# Patient Record
Sex: Female | Born: 1935 | Race: Black or African American | Hispanic: No | Smoking: Never smoker
Health system: Southern US, Community
[De-identification: ages and names within clinical notes are randomized; demographics above are authoritative.]

## PROBLEM LIST (undated history)

## (undated) DIAGNOSIS — K219 Gastro-esophageal reflux disease without esophagitis: Secondary | ICD-10-CM

## (undated) DIAGNOSIS — R0602 Shortness of breath: Secondary | ICD-10-CM

## (undated) DIAGNOSIS — M109 Gout, unspecified: Secondary | ICD-10-CM

## (undated) DIAGNOSIS — R011 Cardiac murmur, unspecified: Secondary | ICD-10-CM

## (undated) DIAGNOSIS — I10 Essential (primary) hypertension: Secondary | ICD-10-CM

## (undated) DIAGNOSIS — I4891 Unspecified atrial fibrillation: Secondary | ICD-10-CM

## (undated) DIAGNOSIS — I38 Endocarditis, valve unspecified: Secondary | ICD-10-CM

## (undated) DIAGNOSIS — C50919 Malignant neoplasm of unspecified site of unspecified female breast: Secondary | ICD-10-CM

## (undated) DIAGNOSIS — E785 Hyperlipidemia, unspecified: Secondary | ICD-10-CM

## (undated) DIAGNOSIS — I509 Heart failure, unspecified: Secondary | ICD-10-CM

## (undated) HISTORY — DX: Endocarditis, valve unspecified: I38

## (undated) HISTORY — DX: Hyperlipidemia, unspecified: E78.5

## (undated) HISTORY — DX: Unspecified atrial fibrillation: I48.91

## (undated) HISTORY — DX: Essential (primary) hypertension: I10

## (undated) HISTORY — DX: Malignant neoplasm of unspecified site of unspecified female breast: C50.919

## (undated) HISTORY — PX: TUBAL LIGATION: SHX77

## (undated) HISTORY — DX: Gastro-esophageal reflux disease without esophagitis: K21.9

---

## 1978-12-28 HISTORY — PX: APPENDECTOMY: SHX54

## 1999-01-18 ENCOUNTER — Ambulatory Visit (HOSPITAL_COMMUNITY): Admission: RE | Admit: 1999-01-18 | Discharge: 1999-01-18 | Payer: Self-pay | Admitting: Family Medicine

## 1999-03-05 ENCOUNTER — Ambulatory Visit (HOSPITAL_COMMUNITY): Admission: RE | Admit: 1999-03-05 | Discharge: 1999-03-05 | Payer: Self-pay | Admitting: *Deleted

## 1999-04-29 HISTORY — PX: CARDIAC VALVE REPLACEMENT: SHX585

## 1999-05-02 ENCOUNTER — Ambulatory Visit (HOSPITAL_COMMUNITY): Admission: RE | Admit: 1999-05-02 | Discharge: 1999-05-02 | Payer: Self-pay | Admitting: Interventional Cardiology

## 1999-05-31 ENCOUNTER — Encounter: Payer: Self-pay | Admitting: Cardiothoracic Surgery

## 1999-06-03 ENCOUNTER — Inpatient Hospital Stay (HOSPITAL_COMMUNITY): Admission: RE | Admit: 1999-06-03 | Discharge: 1999-06-11 | Payer: Self-pay | Admitting: Cardiothoracic Surgery

## 1999-06-03 ENCOUNTER — Encounter: Payer: Self-pay | Admitting: Cardiothoracic Surgery

## 1999-06-04 ENCOUNTER — Encounter: Payer: Self-pay | Admitting: Cardiothoracic Surgery

## 1999-06-05 ENCOUNTER — Encounter: Payer: Self-pay | Admitting: Cardiothoracic Surgery

## 1999-06-06 ENCOUNTER — Encounter: Payer: Self-pay | Admitting: Cardiothoracic Surgery

## 1999-06-07 ENCOUNTER — Encounter: Payer: Self-pay | Admitting: Thoracic Surgery (Cardiothoracic Vascular Surgery)

## 1999-07-10 ENCOUNTER — Encounter (HOSPITAL_COMMUNITY): Admission: RE | Admit: 1999-07-10 | Discharge: 1999-10-08 | Payer: Self-pay | Admitting: Interventional Cardiology

## 2002-09-23 ENCOUNTER — Ambulatory Visit (HOSPITAL_COMMUNITY): Admission: RE | Admit: 2002-09-23 | Discharge: 2002-09-23 | Payer: Self-pay | Admitting: Orthopedic Surgery

## 2003-08-16 ENCOUNTER — Ambulatory Visit (HOSPITAL_COMMUNITY): Admission: RE | Admit: 2003-08-16 | Discharge: 2003-08-17 | Payer: Self-pay | Admitting: Orthopedic Surgery

## 2004-03-14 ENCOUNTER — Ambulatory Visit (HOSPITAL_COMMUNITY): Admission: RE | Admit: 2004-03-14 | Discharge: 2004-03-14 | Payer: Self-pay | Admitting: *Deleted

## 2005-04-01 ENCOUNTER — Ambulatory Visit (HOSPITAL_COMMUNITY): Admission: RE | Admit: 2005-04-01 | Discharge: 2005-04-01 | Payer: Self-pay | Admitting: Orthopedic Surgery

## 2005-10-03 ENCOUNTER — Ambulatory Visit (HOSPITAL_COMMUNITY): Admission: RE | Admit: 2005-10-03 | Discharge: 2005-10-04 | Payer: Self-pay | Admitting: Orthopedic Surgery

## 2005-10-22 ENCOUNTER — Encounter: Admission: RE | Admit: 2005-10-22 | Discharge: 2005-10-22 | Payer: Self-pay | Admitting: Orthopedic Surgery

## 2008-11-01 ENCOUNTER — Encounter: Admission: RE | Admit: 2008-11-01 | Discharge: 2008-11-01 | Payer: Self-pay | Admitting: Orthopedic Surgery

## 2008-11-27 ENCOUNTER — Encounter (INDEPENDENT_AMBULATORY_CARE_PROVIDER_SITE_OTHER): Payer: Self-pay | Admitting: Orthopedic Surgery

## 2008-11-27 ENCOUNTER — Ambulatory Visit (HOSPITAL_COMMUNITY): Admission: RE | Admit: 2008-11-27 | Discharge: 2008-11-27 | Payer: Self-pay | Admitting: Orthopedic Surgery

## 2009-10-02 ENCOUNTER — Ambulatory Visit: Payer: Self-pay | Admitting: Diagnostic Radiology

## 2009-10-02 ENCOUNTER — Emergency Department (HOSPITAL_BASED_OUTPATIENT_CLINIC_OR_DEPARTMENT_OTHER): Admission: EM | Admit: 2009-10-02 | Discharge: 2009-10-02 | Payer: Self-pay | Admitting: Emergency Medicine

## 2010-04-28 HISTORY — PX: BREAST SURGERY: SHX581

## 2010-05-19 ENCOUNTER — Encounter: Payer: Self-pay | Admitting: Family Medicine

## 2010-05-26 ENCOUNTER — Emergency Department (HOSPITAL_COMMUNITY)
Admission: EM | Admit: 2010-05-26 | Discharge: 2010-05-26 | Payer: Self-pay | Source: Home / Self Care | Admitting: Emergency Medicine

## 2010-05-26 LAB — PROTIME-INR: INR: 1.74 — ABNORMAL HIGH (ref 0.00–1.49)

## 2010-08-04 LAB — PROTIME-INR
INR: 1.5 (ref 0.00–1.49)
Prothrombin Time: 18.7 seconds — ABNORMAL HIGH (ref 11.6–15.2)

## 2010-08-04 LAB — CBC
MCHC: 34 g/dL (ref 30.0–36.0)
WBC: 8.2 10*3/uL (ref 4.0–10.5)

## 2010-08-04 LAB — COMPREHENSIVE METABOLIC PANEL
ALT: 30 U/L (ref 0–35)
AST: 45 U/L — ABNORMAL HIGH (ref 0–37)
BUN: 33 mg/dL — ABNORMAL HIGH (ref 6–23)
CO2: 27 mEq/L (ref 19–32)
GFR calc Af Amer: 52 mL/min — ABNORMAL LOW (ref >60)
Total Bilirubin: 0.7 mg/dL (ref 0.3–1.2)
Total Protein: 7.1 g/dL (ref 6.0–8.3)

## 2010-08-04 LAB — APTT: aPTT: 44 seconds — ABNORMAL HIGH (ref 24–37)

## 2010-09-10 NOTE — Op Note (Signed)
[name], [name] NO.:  [id number]   MEDICAL RECORD NO.:  [id number]          PATIENT TYPE:  AMB   LOCATION:  SDS                          FACILITY:  [facility]   PHYSICIAN:  Myrtie Neither, MD      DATE OF BIRTH:  Mar 02, 1934   DATE OF PROCEDURE:  11/27/2008  DATE OF DISCHARGE:  11/27/2008                               OPERATIVE REPORT   PREOPERATIVE DIAGNOSIS:  Foreign body, right foot plantar callosity,  cellulitis.   POSTOPERATIVE DIAGNOSIS:  Foreign body plantar callosity, right foot.   ANESTHESIA:  Local 0.25% Marcaine and mask.   PROCEDURE:  Excision of foreign body, right foot.   The patient was taken to the operating room and after given adequate  preop medication, the patient was given local 0.25% plain Marcaine of  the right foot and mask assistance.  Right foot was prepped with  Betadine scrub and painted with Betadine solution and draped in sterile  manner.  Mini C-arm was used revealed there was visualized foreign body.  An elliptical excision of the plantar callosity over the third  metatarsal head was done down to the fascia.  Irrigation was done.  Further inspection did not reveal any other foreign body.  Mini C-arm  was used to make sure that it was removed.  X-ray did not demonstrate  any metal fragment, which was previously seen on plain x-rays.  Copious  irrigation was done.  Wound was closed with 2-0 nylon.  Compressive  dressing was applied.  A Darkot shoe was applied.  The patient tolerated  the procedure quite well, went to the recovery room in stable and  satisfactory condition.  The patient has been discharged home with use  of Darkot shoe to the right foot, ice packs elevation, Percocet 1 q.6  p.r.n. and return to the office in 1 week.  The patient has been  discharged in stable and satisfactory condition.      Myrtie Neither, MD  Electronically Signed     Myrtie Neither, MD  Electronically Signed    AC/MEDQ  D:  11/27/2008   T:  11/27/2008  Job:  [id number]

## 2010-09-13 NOTE — Op Note (Signed)
[name], MEINDERS             ACCOUNT NO.:  [id number]   MEDICAL RECORD NO.:  [id number]          PATIENT TYPE:  AMB   LOCATION:  SDS                          FACILITY:  [facility]   PHYSICIAN:  Myrtie Neither, MD      DATE OF BIRTH:  Sep 15, 1933   DATE OF PROCEDURE:  04/01/2005  DATE OF DISCHARGE:                                 OPERATIVE REPORT   PREOPERATIVE DIAGNOSIS:  Metatarsalgia, plantar callosity right foot.   POSTOPERATIVE DIAGNOSIS:  Metatarsalgia, plantar callosity right foot.   ANESTHESIA:  Ankle block and IV.   PROCEDURE:  Osteotomy, third right metatarsal.   The patient was taken to the operating room after given adequate preop  medication.  The patient was given ankle block.  Patient also received IV  anesthesia as well for sedation.  Right foot was prepped with DuraPrep and  draped in standard manner.  Bovie used for hemostasis.  Longitudinal  incision was made dorsally over the third metatarsal going through the skin  and subcutaneous tissue.  Sharp and blunt dissection was made down to the  third metatarsal in line with the plantar callosity.  With a very small  oscillating blade, the osteotomy was then done of the third metatarsal,  allowing the distal end to retract proximally.  Small edge of the dorsal  aspect of the distal end of the metatarsal was resected.  Smoothened.  Copious irrigation was done.  Wound closure was then done with 3-0 nylon.  Compressive dressings applied.  The patient tolerated the procedure well and  went to the recovery room in stable and satisfactory condition.   The patient is being kept for 23-hour observation due to her cardiac history  and being on Coumadin.  The patient will be discharged in a.m. on Percocet  one to two q.4 p.r.n. for pain, ice packs, elevation and partial  weightbearing with the use of walker.      Myrtie Neither, MD  Electronically Signed     AC/MEDQ  D:  04/01/2005  T:  04/01/2005  Job:  [id number]

## 2010-09-13 NOTE — Op Note (Signed)
NAME:  [name], [name] NO.:  [id number]   MEDICAL RECORD NO.:  [id number]          PATIENT TYPE:  AMB   LOCATION:  ENDO                         FACILITY:  [facility]   PHYSICIAN:  Meade Maw, M.D.    DATE OF BIRTH:  01-11-1934   DATE OF PROCEDURE:  03/14/2004  DATE OF DISCHARGE:                                 OPERATIVE REPORT   INDICATIONS FOR PROCEDURE:  Evaluation for severe tricuspid regurgitation.   SURGEON:  Seymour Bars, D.O.   PROCEDURE:  After obtaining written informed consent, the patient was  brought to the endoscopy lab in the postabsorptive state.  Preop sedation  was achieved using IV Versed 2 mg, fentanyl 50 mics.  Topical anesthesia was  achieved using Cetacaine spray and viscous Lidocaine.  Following appropriate  sedation, the OmniPlane probe was introduced with digital guidance without  difficulty.  Multiple views were obtained at the midesophageal basal and  deep gastric view.  Color flow was performed across the mitral, tricuspid,  aortic and pulmonic valve.  Spectral Doppler analysis was performed across  the mitral, aortic, tricuspid valve.  Bubble study was performed.  Left  atrial appendage was visualized.  Ascending and descending aortic arch was  visualized.   FINDINGS:  There was mild to moderate left atrial enlargement.  There was  spontaneous contrast noted in the left atrium.  There was no thrombus noted  in the left atrial appendage. The mitral valve ring was intact. There was  mild to moderate mitral regurgitation noted.  There was normal LV  dimensions, mild concentric hypertrophy with preserved systolic function,  grossly normal tricuspid valve morphology. There was severe tricuspid  regurgitation noted, unable to obtain a full envelope across the tricuspid  valve, secondary to the angle.  There was mild right ventricular enlargement  noted. There was mild aortic sclerosis associated with mild aortic  insufficiency.  There was no  significant velocity across the aortic valve.  The descending aortic arch was visualized.  There was minimal aortic plaque  noted in the descending aorta. There was a negative bubble study.   IMPRESSION:  1.  Severe tricuspid regurgitation.  2.  Mild to moderate mitral regurgitation with intact mitral valve ring.  3.  Mild to moderate left atrial enlargement.  4.  Bacterial endocarditis.  Prophylaxis is recommended.      Hele  HP/MEDQ  D:  03/14/2004  T:  03/15/2004  Job:  [id number]   cc:   Lesleigh Noe, M.D.  [address]  [address]  [place]  [place] [address]  Fax: [phone]

## 2010-09-13 NOTE — Op Note (Signed)
[name], [name] NO.:  [id number]   MEDICAL RECORD NO.:  [id number]          PATIENT TYPE:  AMB   LOCATION:  SDS                          FACILITY:  [facility]   PHYSICIAN:  Myrtie Neither, MD      DATE OF BIRTH:  02/13/1934   DATE OF PROCEDURE:  10/03/2005  DATE OF DISCHARGE:                                 OPERATIVE REPORT   PREOP DIAGNOSIS:  Metatarsalgia right foot with plantar callosities and  bunion.   POSTOPERATIVE DIAGNOSIS:  Metatarsalgia with plantar callosity right foot  with bunion.   ANESTHESIA:  Regional ankle block.   PROCEDURE:  Mitchell bunionectomy right foot.   DESCRIPTION OF PROCEDURE:  The patient was taken to the operating room; and  after given adequate preop medications; the patient had regional block to  the right ankle.  The right lower leg was prepped with DuraPrep and draped  in a sterile manner.  Tourniquet and Bovie used for hemostasis.  A medial  incision made over the first metatarsal phalangeal joint going through the  skin, subcutaneous tissue, and going through previous scar.  Sharp and blunt  dissection was made down to the first metatarsal head.  Capsule was  reflected distally.  Bunion was resected with an osteotome.  An osteotomy  was then done of the first metatarsal, shifting the distal fragment distally  and proximally.  This was stable with a 0.62 K-wire.  Capsule was  reattached, irrigation was done, hemostasis obtained with the Bovie.  Subcutaneous closed with 2-0 Vicryl, skin with 4-0 nylon.  A compressive  dressing was applied.  Pin was bent and cut external to the skin.  The  patient tolerated the procedure quite well and went to the recovery room in  stable and satisfactory condition.  The patient was placed in the fracture  shoe .  The patient will be kept for 23-hour observation.  Ice packs, pain  control; and will be discharged in the a.m. on Percocet 1-2 q.4 h. p.r.n.  for pain.  Nonweightbearing on the  right foot.      Myrtie Neither, MD  Electronically Signed     AC/MEDQ  D:  10/03/2005  T:  10/03/2005  Job:  [id number]

## 2010-10-10 ENCOUNTER — Other Ambulatory Visit: Payer: Self-pay

## 2010-10-11 ENCOUNTER — Other Ambulatory Visit: Payer: Self-pay | Admitting: Radiology

## 2010-10-11 DIAGNOSIS — C50911 Malignant neoplasm of unspecified site of right female breast: Secondary | ICD-10-CM

## 2010-10-15 ENCOUNTER — Ambulatory Visit
Admission: RE | Admit: 2010-10-15 | Discharge: 2010-10-15 | Disposition: A | Discharge status: Home / Self Care | Payer: Medicare Other | Source: Ambulatory Visit | Attending: Radiology | Admitting: Radiology

## 2010-10-15 DIAGNOSIS — C50911 Malignant neoplasm of unspecified site of right female breast: Secondary | ICD-10-CM

## 2010-10-15 MED ORDER — GADOBENATE DIMEGLUMINE 529 MG/ML IV SOLN
8.0000 mL | Freq: Once | INTRAVENOUS | Status: AC | PRN
  Administered 2010-10-15: 8 mL via INTRAVENOUS

## 2010-10-21 ENCOUNTER — Encounter (INDEPENDENT_AMBULATORY_CARE_PROVIDER_SITE_OTHER): Payer: Self-pay | Admitting: General Surgery

## 2010-10-21 ENCOUNTER — Ambulatory Visit (INDEPENDENT_AMBULATORY_CARE_PROVIDER_SITE_OTHER): Payer: Medicare Other | Admitting: General Surgery

## 2010-10-21 DIAGNOSIS — C50919 Malignant neoplasm of unspecified site of unspecified female breast: Secondary | ICD-10-CM

## 2010-10-21 DIAGNOSIS — I38 Endocarditis, valve unspecified: Secondary | ICD-10-CM

## 2010-10-21 NOTE — Patient Instructions (Signed)
You will be referred to a medical oncologist and Dr. Mendel Ryder for pre op evaluations.

## 2010-10-21 NOTE — Progress Notes (Signed)
Subjective:     Patient ID: [name], female   DOB: [birth year], 77 y.o.   MRN: [id number]    BP 158/92  Pulse 80  Temp(Src) 97 F (36.1 C) (Temporal)  Ht 5\' 2"  (1.575 m)  Wt 163 lb 12.8 oz (74.299 kg)  BMI 29.96 kg/m2    HPI This is a 77 year old Latin American female referred by the psoas breast imaging for evaluation of a newly diagnosed invasive ductal carcinoma in the right breast superiorly. The patient was unaware of this, but now she can feel it.She says that she gets mammograms every year and his year. She has had mammograms,ultrasound, MRI, and image guided biopsy. She has a solid sort of solitary, 1.4 cm mass in the right breast, upper central area. The left breast looks normal. The lymph nodes looked normal. There is a 4.5 cm left hepatic lobe cyst. We have discussed her case at  Breast multidisciplinary conference last week. She is felt to be a candidate for definitive breast surgery, but medical oncology feels that she will need adjuvant chemotherapy post op. I have reviewed her mammograms, and her MRI.  The pathology report show  invasive ductal carcinoma, high-grade, a triple negative tumor  Review of Systems  Constitutional: Negative.   HENT: Negative.   Eyes: Negative.   Respiratory: Negative.   Cardiovascular: Negative.   Gastrointestinal: Negative.   Genitourinary: Negative.   Musculoskeletal: Negative.   Skin: Negative.   Neurological: Negative.   Hematological: Negative.   Psychiatric/Behavioral: Negative.        Objective:   Physical Exam  Constitutional: She is oriented to person, place, and time. She appears well-developed. No distress.  HENT:  Head: Normocephalic and atraumatic.  Eyes: Left eye exhibits no discharge. No scleral icterus.  Neck: Normal range of motion. No JVD present. No tracheal deviation present. No thyromegaly present.  Cardiovascular: Normal rate.  An irregular rhythm present. PMI is not displaced.  Exam reveals decreased  pulses.   Murmur heard.  Systolic murmur is present with a grade of 3/6  Pulmonary/Chest: Breath sounds normal. No accessory muscle usage. No apnea and not bradypneic. No respiratory distress. She has no wheezes. She has no rales. Right breast exhibits mass.         1.5 cm. Mobile mass right breast mass at 12 o'clock 3 cm. Above areola  Abdominal: Soft. Bowel sounds are normal.  Musculoskeletal: Normal range of motion.  Lymphadenopathy:    She has no cervical adenopathy.  Neurological: She is alert and oriented to person, place, and time.       Poor historian  Skin: Skin is warm and dry. She is not diaphoretic.  Psychiatric: She has a normal mood and affect. Her behavior is normal.       Assessment:     1. Invasive breast carcinoma, right breast, 12:00 position, T1C, N0 clinical stage, triple negative tumor. 2. Valvular heart disease, status post heart valve replacement on Coumadin. 3. Hypertension 4. Glaucoma. Status post appendectomy    Plan:     I had a long discussion with the patient and her son about the management of her breast cancer. We talked about the fact that this is an invasive, high-grade, triple negative tumor. She is aware that we have discussed her care and best multidisciplinary conference, and that the recommendation was for her to have definitive breast surgery, a Port-A-Cath, and adjuvant chemotherapy. She is willing to consider this but would like to speak the medical oncologist first.  It is her desire to have breast conservation surgery if possible, I think that we can do this, although she will lose some volume in the upper pole of her right breast. We will refer her to one of the breast oncologist to discuss the pros and cons of chemotherapy and to make a final decision about insertion of Port-A-Cath, for definitive breast surgery. She'll be referred to Dr. Veatrice Kells her cardiologist, for preoperative clearance and a decision regarding management of her  Coumadin in the perioperative period. She will return to see me after she sees these 2 physicians to make final decisions regarding her definitive breast surgery. If everything goes according to plan, we will schedule her for right partial mastectomy with needle localization, right sentinel lymph node mapping and biopsy and insertion of Port-A-Cath at hospital near future. I will see her back in the office in about 2 weeks.

## 2010-10-24 ENCOUNTER — Other Ambulatory Visit: Payer: Self-pay | Admitting: Oncology

## 2010-10-24 ENCOUNTER — Emergency Department (HOSPITAL_COMMUNITY): Payer: Medicare Other

## 2010-10-24 ENCOUNTER — Encounter (HOSPITAL_BASED_OUTPATIENT_CLINIC_OR_DEPARTMENT_OTHER): Payer: Medicare Other | Admitting: Oncology

## 2010-10-24 ENCOUNTER — Inpatient Hospital Stay (HOSPITAL_COMMUNITY)
Admission: EM | Admit: 2010-10-24 | Discharge: 2010-11-05 | DRG: 682 | Disposition: A | Discharge status: Home Health | Payer: Medicare Other | Attending: Internal Medicine | Admitting: Internal Medicine

## 2010-10-24 DIAGNOSIS — E875 Hyperkalemia: Secondary | ICD-10-CM | POA: Diagnosis present

## 2010-10-24 DIAGNOSIS — N179 Acute kidney failure, unspecified: Principal | ICD-10-CM | POA: Diagnosis present

## 2010-10-24 DIAGNOSIS — I872 Venous insufficiency (chronic) (peripheral): Secondary | ICD-10-CM | POA: Diagnosis present

## 2010-10-24 DIAGNOSIS — C50919 Malignant neoplasm of unspecified site of unspecified female breast: Secondary | ICD-10-CM | POA: Diagnosis present

## 2010-10-24 DIAGNOSIS — R791 Abnormal coagulation profile: Secondary | ICD-10-CM | POA: Diagnosis present

## 2010-10-24 DIAGNOSIS — I4891 Unspecified atrial fibrillation: Secondary | ICD-10-CM | POA: Diagnosis present

## 2010-10-24 DIAGNOSIS — I509 Heart failure, unspecified: Secondary | ICD-10-CM | POA: Diagnosis present

## 2010-10-24 DIAGNOSIS — C50219 Malignant neoplasm of upper-inner quadrant of unspecified female breast: Secondary | ICD-10-CM

## 2010-10-24 DIAGNOSIS — R609 Edema, unspecified: Secondary | ICD-10-CM | POA: Diagnosis present

## 2010-10-24 DIAGNOSIS — K560 Paralytic ileus: Secondary | ICD-10-CM | POA: Diagnosis present

## 2010-10-24 DIAGNOSIS — Z954 Presence of other heart-valve replacement: Secondary | ICD-10-CM

## 2010-10-24 DIAGNOSIS — Z171 Estrogen receptor negative status [ER-]: Secondary | ICD-10-CM

## 2010-10-24 DIAGNOSIS — E872 Acidosis, unspecified: Secondary | ICD-10-CM | POA: Diagnosis present

## 2010-10-24 DIAGNOSIS — I5033 Acute on chronic diastolic (congestive) heart failure: Secondary | ICD-10-CM | POA: Diagnosis present

## 2010-10-24 DIAGNOSIS — M109 Gout, unspecified: Secondary | ICD-10-CM | POA: Diagnosis not present

## 2010-10-24 DIAGNOSIS — I2789 Other specified pulmonary heart diseases: Secondary | ICD-10-CM | POA: Diagnosis present

## 2010-10-24 DIAGNOSIS — N189 Chronic kidney disease, unspecified: Secondary | ICD-10-CM | POA: Diagnosis present

## 2010-10-24 DIAGNOSIS — T45515A Adverse effect of anticoagulants, initial encounter: Secondary | ICD-10-CM | POA: Diagnosis present

## 2010-10-24 LAB — COMPREHENSIVE METABOLIC PANEL
ALT: 34 U/L (ref 0–35)
AST: 36 U/L (ref 0–37)
Albumin: 3.3 g/dL — ABNORMAL LOW (ref 3.5–5.2)
Alkaline Phosphatase: 128 U/L — ABNORMAL HIGH (ref 39–117)
CO2: 16 mEq/L — ABNORMAL LOW (ref 19–32)
Calcium: 8.7 mg/dL (ref 8.4–10.5)
Chloride: 108 mEq/L (ref 96–112)
Creatinine, Ser: 3.66 mg/dL — ABNORMAL HIGH (ref 0.50–1.10)
Creatinine, Ser: 3.61 mg/dL — ABNORMAL HIGH (ref 0.50–1.10)
GFR calc Af Amer: 15 mL/min — ABNORMAL LOW (ref >60)
GFR calc non Af Amer: 12 mL/min — ABNORMAL LOW (ref >60)
Glucose, Bld: 89 mg/dL (ref 70–99)
Potassium: 6.7 mEq/L (ref 3.5–5.3)
Total Bilirubin: 0.3 mg/dL (ref 0.3–1.2)
Total Bilirubin: 0.2 mg/dL — ABNORMAL LOW (ref 0.3–1.2)

## 2010-10-24 LAB — CBC WITH DIFFERENTIAL/PLATELET
Basophils Absolute: 0 10*3/uL (ref 0.0–0.1)
EOS%: 1.8 % (ref 0.0–7.0)
Eosinophils Absolute: 0.2 10*3/uL (ref 0.0–0.5)
MCH: 29.4 pg (ref 25.1–34.0)
MCV: 89.2 fL (ref 79.5–101.0)
MONO#: 0.9 10*3/uL (ref 0.1–0.9)
NEUT#: 7.5 10*3/uL — ABNORMAL HIGH (ref 1.5–6.5)
Platelets: 226 10*3/uL (ref 145–400)
WBC: 9.5 10*3/uL (ref 3.9–10.3)

## 2010-10-24 LAB — CBC
MCH: 28.5 pg (ref 26.0–34.0)
MCV: 89 fL (ref 78.0–100.0)
Platelets: 198 10*3/uL (ref 150–400)
RBC: 3.19 MIL/uL — ABNORMAL LOW (ref 3.87–5.11)
RDW: 16.6 % — ABNORMAL HIGH (ref 11.5–15.5)
WBC: 9.4 10*3/uL (ref 4.0–10.5)

## 2010-10-24 LAB — DIFFERENTIAL
Basophils Absolute: 0.1 10*3/uL (ref 0.0–0.1)
Eosinophils Absolute: 0.2 10*3/uL (ref 0.0–0.7)
Lymphocytes Relative: 11 % — ABNORMAL LOW (ref 12–46)
Monocytes Absolute: 0.9 10*3/uL (ref 0.1–1.0)
Neutro Abs: 7.2 10*3/uL (ref 1.7–7.7)
Neutrophils Relative %: 76 % (ref 43–77)

## 2010-10-24 LAB — PRO B NATRIURETIC PEPTIDE: Pro B Natriuretic peptide (BNP): 8910 pg/mL — ABNORMAL HIGH (ref 0–125)

## 2010-10-24 LAB — PROTIME-INR: Prothrombin Time: 46.7 seconds — ABNORMAL HIGH (ref 11.6–15.2)

## 2010-10-25 ENCOUNTER — Inpatient Hospital Stay (HOSPITAL_COMMUNITY): Payer: Medicare Other

## 2010-10-25 ENCOUNTER — Other Ambulatory Visit (HOSPITAL_COMMUNITY): Payer: Medicare Other

## 2010-10-25 DIAGNOSIS — R141 Gas pain: Secondary | ICD-10-CM

## 2010-10-25 DIAGNOSIS — R143 Flatulence: Secondary | ICD-10-CM

## 2010-10-25 DIAGNOSIS — K560 Paralytic ileus: Secondary | ICD-10-CM

## 2010-10-25 DIAGNOSIS — R142 Eructation: Secondary | ICD-10-CM

## 2010-10-25 DIAGNOSIS — E876 Hypokalemia: Secondary | ICD-10-CM

## 2010-10-25 LAB — CBC
Hemoglobin: 8.9 g/dL — ABNORMAL LOW (ref 12.0–15.0)
MCH: 28.7 pg (ref 26.0–34.0)
MCH: 28.3 pg (ref 26.0–34.0)
MCHC: 32.6 g/dL (ref 30.0–36.0)
MCHC: 31.8 g/dL (ref 30.0–36.0)
MCV: 88.1 fL (ref 78.0–100.0)
MCV: 89.2 fL (ref 78.0–100.0)
Platelets: 180 10*3/uL (ref 150–400)
RBC: 3.1 MIL/uL — ABNORMAL LOW (ref 3.87–5.11)
RBC: 3.14 MIL/uL — ABNORMAL LOW (ref 3.87–5.11)
RDW: 16.8 % — ABNORMAL HIGH (ref 11.5–15.5)

## 2010-10-25 LAB — PROTIME-INR: Prothrombin Time: 56.8 seconds — ABNORMAL HIGH (ref 11.6–15.2)

## 2010-10-25 LAB — URINALYSIS, MICROSCOPIC ONLY
Glucose, UA: NEGATIVE mg/dL
Ketones, ur: NEGATIVE mg/dL
Nitrite: NEGATIVE
Specific Gravity, Urine: 1.02 (ref 1.005–1.030)
pH: 5 (ref 5.0–8.0)

## 2010-10-25 LAB — IRON AND TIBC
Saturation Ratios: 13 % — ABNORMAL LOW (ref 20–55)
TIBC: 260 ug/dL (ref 250–470)
UIBC: 227 ug/dL

## 2010-10-25 LAB — LIPID PANEL
Cholesterol: 126 mg/dL (ref 0–200)
HDL: 43 mg/dL (ref >39)
Triglycerides: 55 mg/dL (ref <150)
VLDL: 11 mg/dL (ref 0–40)

## 2010-10-25 LAB — BASIC METABOLIC PANEL
BUN: 38 mg/dL — ABNORMAL HIGH (ref 6–23)
BUN: 47 mg/dL — ABNORMAL HIGH (ref 6–23)
CO2: 15 mEq/L — ABNORMAL LOW (ref 19–32)
CO2: 17 mEq/L — ABNORMAL LOW (ref 19–32)
CO2: 19 mEq/L (ref 19–32)
Calcium: 7.9 mg/dL — ABNORMAL LOW (ref 8.4–10.5)
Calcium: 8.6 mg/dL (ref 8.4–10.5)
Chloride: 107 mEq/L (ref 96–112)
Creatinine, Ser: 3.03 mg/dL — ABNORMAL HIGH (ref 0.50–1.10)
Creatinine, Ser: 3.5 mg/dL — ABNORMAL HIGH (ref 0.50–1.10)
GFR calc Af Amer: 17 mL/min — ABNORMAL LOW (ref >60)
GFR calc non Af Amer: 13 mL/min — ABNORMAL LOW (ref >60)
GFR calc non Af Amer: 15 mL/min — ABNORMAL LOW (ref >60)
Glucose, Bld: 110 mg/dL — ABNORMAL HIGH (ref 70–99)
Glucose, Bld: 84 mg/dL (ref 70–99)
Potassium: 5.3 mEq/L — ABNORMAL HIGH (ref 3.5–5.1)
Sodium: 137 mEq/L (ref 135–145)

## 2010-10-25 LAB — CARDIAC PANEL(CRET KIN+CKTOT+MB+TROPI)
Relative Index: 3.1 — ABNORMAL HIGH (ref 0.0–2.5)
Total CK: 161 U/L (ref 7–177)
Troponin I: <0.3 ng/mL (ref <0.30)
Troponin I: <0.3 ng/mL (ref <0.30)
Troponin I: <0.3 ng/mL (ref <0.30)

## 2010-10-25 LAB — FERRITIN: Ferritin: 249 ng/mL (ref 10–291)

## 2010-10-25 LAB — LACTIC ACID, PLASMA: Lactic Acid, Venous: 0.8 mmol/L (ref 0.5–2.2)

## 2010-10-25 LAB — URIC ACID: Uric Acid, Serum: 10.2 mg/dL — ABNORMAL HIGH (ref 2.4–7.0)

## 2010-10-25 LAB — MRSA PCR SCREENING: MRSA by PCR: NEGATIVE

## 2010-10-26 DIAGNOSIS — I5033 Acute on chronic diastolic (congestive) heart failure: Secondary | ICD-10-CM

## 2010-10-26 LAB — DIFFERENTIAL
Eosinophils Absolute: 0 10*3/uL (ref 0.0–0.7)
Eosinophils Relative: 0 % (ref 0–5)
Lymphocytes Relative: 7 % — ABNORMAL LOW (ref 12–46)
Lymphs Abs: 0.6 10*3/uL — ABNORMAL LOW (ref 0.7–4.0)
Monocytes Absolute: 0.5 10*3/uL (ref 0.1–1.0)

## 2010-10-26 LAB — BASIC METABOLIC PANEL
BUN: 30 mg/dL — ABNORMAL HIGH (ref 6–23)
Calcium: 7.8 mg/dL — ABNORMAL LOW (ref 8.4–10.5)
Chloride: 113 mEq/L — ABNORMAL HIGH (ref 96–112)
Creatinine, Ser: 2.5 mg/dL — ABNORMAL HIGH (ref 0.50–1.10)
GFR calc Af Amer: 23 mL/min — ABNORMAL LOW (ref >60)
GFR calc non Af Amer: 19 mL/min — ABNORMAL LOW (ref >60)

## 2010-10-26 LAB — CBC
HCT: 27.9 % — ABNORMAL LOW (ref 36.0–46.0)
MCH: 28.7 pg (ref 26.0–34.0)
MCHC: 32.3 g/dL (ref 30.0–36.0)
MCV: 88.9 fL (ref 78.0–100.0)
Platelets: 179 10*3/uL (ref 150–400)
RDW: 16.8 % — ABNORMAL HIGH (ref 11.5–15.5)
WBC: 9.1 10*3/uL (ref 4.0–10.5)

## 2010-10-26 LAB — PROTIME-INR: Prothrombin Time: 62.5 seconds — ABNORMAL HIGH (ref 11.6–15.2)

## 2010-10-27 LAB — CBC
MCH: 29.3 pg (ref 26.0–34.0)
MCHC: 33.3 g/dL (ref 30.0–36.0)
MCV: 87.9 fL (ref 78.0–100.0)
Platelets: 182 10*3/uL (ref 150–400)
RBC: 3.07 MIL/uL — ABNORMAL LOW (ref 3.87–5.11)

## 2010-10-27 LAB — BASIC METABOLIC PANEL
CO2: 20 mEq/L (ref 19–32)
Calcium: 8.4 mg/dL (ref 8.4–10.5)
Creatinine, Ser: 2.33 mg/dL — ABNORMAL HIGH (ref 0.50–1.10)
GFR calc non Af Amer: 20 mL/min — ABNORMAL LOW (ref >60)
Sodium: 140 mEq/L (ref 135–145)

## 2010-10-27 LAB — PROTIME-INR
INR: 5.49 (ref 0.00–1.49)
Prothrombin Time: 50.7 seconds — ABNORMAL HIGH (ref 11.6–15.2)

## 2010-10-27 NOTE — H&P (Signed)
[name], HENSARLING NO.:  [id number]  MEDICAL RECORD NO.:  [id number]  LOCATION:  WLED                         FACILITY:  [facility]  PHYSICIAN:  Lonia Blood, M.D.      DATE OF BIRTH:  1933/05/15  DATE OF ADMISSION:  10/24/2010 DATE OF DISCHARGE:                             HISTORY & PHYSICAL   PRIMARY CARE PHYSICIAN:  [facility].  PRESENTING COMPLAINT:  Generalized fatigue and abnormal labs.  HISTORY OF PRESENT ILLNESS:  The patient is a 77 year old female who was recently diagnosed with breast cancer from mammogram and is not yet on any chemoradiation.  She went to the cancer center today for blood work, refractory to treatment.  She will have surgery and XRT, but not chemo. She has been having some mild dyspnea on exertion in the last 1-2 weeks and has also noticed generalized weakness.  She normally uses a cane for some unsteady gait.  After blood was drawn, she was on her way home when she was called and asked to come to the emergency room due to abnormal labs.  Her potassium apparently was 6.7 over there. Here in the ED, the potassium was found to be 7.0 with a BUN 49, creatinine 3.61, this seems to be abnormally higher than her usual numbers.  Hence, she is being admitted for further management.  She denied any specific chest pain. Her shortness of breath is occasional on exertion, but not right now. No cough.  No fever.  No chills.  The patient only complains of her left shoulder aching and having pain, otherwise no other complaints.  PAST MEDICAL HISTORY:  Positive for, 1. Diastolic dysfunction with CHF. 2. Atrial fibrillation. 3. History of mitral regurgitation.  She is status post mitral valve     repair and angioplasty, on chronic Coumadin therapy. 4. Hyperlipidemia. 5. Hypertension. 6. Chronic venous insufficiency. 7. History of prior foot surgeries for callosity, cellulitis, and     plantar pain, all done by Dr. Myrtie Neither.  ALLERGIES:  She has no known drug allergies.  CURRENT MEDICATIONS: 1. Amlodipine 10 mg daily. 2. Clonidine 0.1 mg t.i.d. 3. Metoprolol 100 mg b.i.d. 4. Simvastatin 20 mg daily. 5. Warfarin. 6. Lasix 80 mg daily. 7. Potassium chloride 20 mEq daily.  SOCIAL HISTORY:  The patient lives in [place].  She is widowed.  No tobacco, alcohol, or IV drug use.  She uses a cane to move around and she is very much weighted, unable to do all her ADLs.  FAMILY HISTORY:  Noncontributory due to the patient's age.  REVIEW OF SYSTEMS:  All systems reviewed are negative except per HPI.  PHYSICAL EXAMINATION:  VITAL SIGNS:  Temperature is 97.3, blood pressure 123/75 with pulse 86, respiratory rate 20, saturation 97% on room air. GENERAL:  She is awake, alert, oriented, very pleasant woman, in no acute distress. HEENT:  PERRL.  EOMI.  No pallor.  No jaundice.  No rhinorrhea. NECK:  Supple.  No visible JVD.  No lymphadenopathy. RESPIRATORY:  She has good air entry bilaterally.  No wheezes.  No rales.  No crackles. CARDIOVASCULAR SYSTEM:  She has S1 and S2.  No audible murmur. ABDOMEN:  Soft,  full, nontender with positive bowel sounds. EXTREMITIES:  She has chronic venous stasis dermatitis.  Otherwise trace edema, mottled toes. SKIN:  She has very chronic changes in the lower extremities, consistent with chronic stasis dermatitis.  Otherwise no other rashes or ulcers. MUSCULOSKELETAL:  She has some mottled, disfigured joints of the toes. Otherwise, no other did bigger joints involvement.  LABORATORY DATA:  BNP is 8910, otherwise cardiac enzymes negative. Sodium 135, potassium 7.0, chloride 108, CO2 of 16, glucose 89, BUN 49, creatinine 3.61 with a calcium of 8.7.  White count is 9.4, hemoglobin 9.1 with platelet count of 198.  PT 46.0, INR 4.96.  Her chest x-ray showed no acute cardiopulmonary disease.  EKG showed normal sinus rhythm with a rate of 59, no significant ST-T wave  changes.  Her EKG showed atrial fibrillation with borderline R-wave progression, but no significant ST-T wave changes.  ASSESSMENT:  This is a 77 year old female presenting with acute renal failure, more than likely secondary to dehydration and use of Lasix. Other findings are hyperkalemia which may have resulted from a combination of acute renal failure and potassium supplementation.  She also has recent diagnosis of breast cancer and also known history of congestive heart failure, but we do not have note.  Her primary cardiologist is Dr. Verdis Prime.  PLAN: 1. Hyperkalemia, more than likely from potassium supplementation and     acute renal failure.  She has received D50 and insulin in the ED.     Our next plan is to get her on Kayexalate and follow closely with     her potassium levels.  I will repeat the BMP now after receiving     the D50 and see if she makes any changes to her potassium. 2. Acute renal failure.  Again, more than likely this is related to     overuse of Lasix.  We will hold all diuretics and gently hydrate     her while getting 2-D echo to get her true EF to see if we can give     her more fluids. 3. Chronic stasis dermatitis.  This is not acute.  This is chronic and     we will not do much intensive care except to keep her on home care. 4. Atrial fibrillation.  We will keep on her telemetry monitoring and     Continue with her Coumadin.  Her INR is elevated high, so we will     hold the Coumadin for tonight though we will continue to dose it     appropriately. 5. Recent diagnosis of breast cancer.  She is going to have an     outpatient lumpectomy and radiation. 6. Hyperlipidemia.  Continue with her statin. 7. Hypertension.  Blood pressure is reasonable, continue with current     medications. 8. Normocytic anemia, probably anemia of chronic disease.  Follow     hemoglobin and hematocrit closely.  At this point, there is no need     for transfusion  otherwise.     Lonia Blood, M.D.     Verlin Grills  D:  10/24/2010  T:  10/24/2010  Job:  [id number]  Electronically Signed by Lonia Blood M.D. on 10/27/2010 12:51:12 AM

## 2010-10-28 DIAGNOSIS — C50919 Malignant neoplasm of unspecified site of unspecified female breast: Secondary | ICD-10-CM

## 2010-10-28 LAB — BASIC METABOLIC PANEL
BUN: 29 mg/dL — ABNORMAL HIGH (ref 6–23)
CO2: 21 mEq/L (ref 19–32)
Calcium: 7.9 mg/dL — ABNORMAL LOW (ref 8.4–10.5)
Chloride: 107 mEq/L (ref 96–112)
Creatinine, Ser: 1.93 mg/dL — ABNORMAL HIGH (ref 0.50–1.10)
GFR calc Af Amer: 31 mL/min — ABNORMAL LOW (ref >60)
GFR calc non Af Amer: 25 mL/min — ABNORMAL LOW (ref >60)
Glucose, Bld: 97 mg/dL (ref 70–99)
Potassium: 3.4 mEq/L — ABNORMAL LOW (ref 3.5–5.1)
Sodium: 135 mEq/L (ref 135–145)

## 2010-10-29 LAB — BASIC METABOLIC PANEL
BUN: 32 mg/dL — ABNORMAL HIGH (ref 6–23)
Chloride: 108 mEq/L (ref 96–112)
Creatinine, Ser: 1.74 mg/dL — ABNORMAL HIGH (ref 0.50–1.10)
Glucose, Bld: 127 mg/dL — ABNORMAL HIGH (ref 70–99)
Potassium: 3.5 mEq/L (ref 3.5–5.1)

## 2010-10-29 LAB — PROTIME-INR: INR: 2.65 — ABNORMAL HIGH (ref 0.00–1.49)

## 2010-10-30 LAB — CBC
HCT: 26.4 % — ABNORMAL LOW (ref 36.0–46.0)
Hemoglobin: 8.5 g/dL — ABNORMAL LOW (ref 12.0–15.0)
MCV: 86.8 fL (ref 78.0–100.0)
WBC: 15.4 10*3/uL — ABNORMAL HIGH (ref 4.0–10.5)

## 2010-10-30 LAB — URINALYSIS, MICROSCOPIC ONLY
Bilirubin Urine: NEGATIVE
Hgb urine dipstick: NEGATIVE
Ketones, ur: NEGATIVE mg/dL
Nitrite: NEGATIVE
Specific Gravity, Urine: 1.016 (ref 1.005–1.030)
Urobilinogen, UA: 0.2 mg/dL (ref 0.0–1.0)
pH: 5.5 (ref 5.0–8.0)

## 2010-10-30 LAB — BASIC METABOLIC PANEL
BUN: 36 mg/dL — ABNORMAL HIGH (ref 6–23)
Creatinine, Ser: 1.56 mg/dL — ABNORMAL HIGH (ref 0.50–1.10)
GFR calc Af Amer: 39 mL/min — ABNORMAL LOW (ref >60)
GFR calc non Af Amer: 32 mL/min — ABNORMAL LOW (ref >60)
Glucose, Bld: 138 mg/dL — ABNORMAL HIGH (ref 70–99)

## 2010-10-30 LAB — PROTIME-INR: INR: 2.13 — ABNORMAL HIGH (ref 0.00–1.49)

## 2010-10-31 LAB — DIFFERENTIAL
Basophils Absolute: 0 10*3/uL (ref 0.0–0.1)
Basophils Relative: 0 % (ref 0–1)
Lymphocytes Relative: 4 % — ABNORMAL LOW (ref 12–46)
Monocytes Absolute: 1 10*3/uL (ref 0.1–1.0)
Neutro Abs: 14.7 10*3/uL — ABNORMAL HIGH (ref 1.7–7.7)
Neutrophils Relative %: 90 % — ABNORMAL HIGH (ref 43–77)

## 2010-10-31 LAB — BASIC METABOLIC PANEL
CO2: 22 mEq/L (ref 19–32)
Chloride: 108 mEq/L (ref 96–112)
GFR calc Af Amer: 44 mL/min — ABNORMAL LOW (ref >60)
Potassium: 3.3 mEq/L — ABNORMAL LOW (ref 3.5–5.1)
Sodium: 138 mEq/L (ref 135–145)

## 2010-10-31 LAB — CBC
HCT: 27.5 % — ABNORMAL LOW (ref 36.0–46.0)
Hemoglobin: 9.1 g/dL — ABNORMAL LOW (ref 12.0–15.0)
RBC: 3.17 MIL/uL — ABNORMAL LOW (ref 3.87–5.11)

## 2010-10-31 LAB — PROTIME-INR
INR: 1.93 — ABNORMAL HIGH (ref 0.00–1.49)
Prothrombin Time: 22.4 seconds — ABNORMAL HIGH (ref 11.6–15.2)

## 2010-10-31 NOTE — Consult Note (Signed)
[name], [name] NO.:  [id number]  MEDICAL RECORD NO.:  [id number]  LOCATION:  1241                         FACILITY:  [facility]  PHYSICIAN:  Lyn Records, M.D.   DATE OF BIRTH:  1933-12-23  DATE OF CONSULTATION:  10/25/2010 DATE OF DISCHARGE:                                CONSULTATION   CONCLUSIONS: 1. Hyperkalemia with potassium of 7, secondary to acute-on-chronic     renal failure and a potentially dangerous medication regimen if not     followed closely that includes oral potassium, ACE inhibitor     therapy, eplerenone, and a recent reduction in loop diuretic     intensity.     a.     Typical creatinines over the past 2 years have been between      1.4 and 1.6, last checked in my office July 2011.     b.     Recent adjustment in diuretic therapy with apparent      reduction the Lasix dose from 160 mg per day down to 20 mg per day      and perhaps an Aygestin and potassium intake. 2. History of mitral valve disease with severe mitral regurgitation,     treated by mitral valve repair in 2001. 3. Chronic diastolic heart failure.     a.     Last left ventricular ejection fraction 60% in 2009, done at      [facility]. 4. Chronic severe lower extremity edema, secondary to pulmonary     hypertension and chronic venous insufficiency.     a.     Chronic severe stasis skin changes with lichenification of      skin. 5. Chronic atrial fibrillation with usual controlled rate on beta-     blocker therapy, metoprolol succinate 100 mg daily.  RECOMMENDATIONS: 1. Agree with gentle hydration. 2. Kayexalate. 3. Agree with current management of hyperkalemia. 4. Hold the eplerenone, Lasix, potassium supplements, and lisinopril. 5. Will need to re-add her loop diuretic therapy prior to discharge     and I think, she will need more than 20 mg per day, otherwise the     lower extremity edema becomes severe.  Lisinopril and eplerenone     may be restarted at  some later date, but at the current time,     especially given the patient's confusion concerning her     medications, she should probably not be on both distal tubular     aldosterone inhibitor and isosorbide. 6. Continue beta-blocker therapy.  COMMENT:  The patient is well-known to me.  She is 77 years of age and recently diagnosed with breast cancer.  Laboratory data in preparation for a procedure demonstrated severe hyperkalemia with acute-on-chronic renal insufficiency.  She was admitted last knight by the hospitalist service.  I spoke with Dr. Maryjo Rochester this morning and is seeing the patient to provide background information.  She denies shortness of breath, orthopnea, chest pain, syncope, but has had lower extremity cramping, which is severe.  She has been reluctant to use the doses of diuretic therapy that I would recommend because of frequent urination and cramping in her legs.  Compared to my prior note  from January, there has been a significant change in her medical regimen since last saw her. Apparently, Lasix has been decreased to 20 mg per day and eplerenone has been decreased to once a day from twice a day.  There is also some mention of taking extra potassium tablets for leg cramps.  PAST MEDICAL HISTORY:  Please see my office notes and also the problem list above.  PHYSICAL EXAMINATION:  VITAL SIGNS:  On exam, the patient's blood pressure is 110/70, heart rate is 110. NECK:  Veins are not distended. LUNGS:  Reveal no rales. CARDIAC:  Reveals a 3/6 crescendo-decrescendo murmur that increases with inspiration, compatible with tricuspid regurgitation.  There is would like transformation of the lower extremity scan due to severe chronic stasis dermatitis.  EKG on admission demonstrated bradycardia, relative with heart rate of 59 beats per minute, but no acute T-wave abnormality.  No evidence of AV block and atrial fibrillation is present.  Her chest x-ray revealed  no acute cardiopulmonary process.  LABORATORY DATA:  Reveals an INR of 6.3 increasing from 4.9 on admission and potassium now down to 5.3, creatinine is 3.28 down from 3.5, BUN is 38 from 47.  BNP is 8910 with normal troponins.  DISCUSSION:  The patient has mitral valve disease, pulmonary hypertension, severe tricuspid regurgitation, and is status post mitral valve repair.  She was admitted to the hospital with dehydration and hyperkalemia.  There had been recent adjustments in her chronic medical regimen performed by her primary care physician.  She cannot remember the primary care physician's name and unfortunately, I do not have that, one of the physicians in [facility].  My recommendation would be to hold lisinopril, potassium, eplerenone, and for the time being, Lasix into her BUN and creatinine of back into the typical range of 1.6 and 35.  I would then resume her loop diuretic and I believe she will need more than 20 mg per day of furosemide.  We have used less than 80 mg per day, her legs swell tremendously and she develops pulmonary congestion.  Whether or not the eplerenone and lisinopril should ever be restarted, will depend upon stability of her renal function and understanding from medical regimen.  Will follow with you.     Lyn Records, M.D.     HWS/MEDQ  D:  10/25/2010  T:  10/26/2010  Job:  [id number]  Electronically Signed by Verdis Prime M.D. on 10/31/2010 01:40:14 PM

## 2010-11-01 ENCOUNTER — Inpatient Hospital Stay (HOSPITAL_COMMUNITY): Payer: Medicare Other

## 2010-11-01 LAB — BASIC METABOLIC PANEL
CO2: 25 mEq/L (ref 19–32)
Calcium: 8 mg/dL — ABNORMAL LOW (ref 8.4–10.5)
Potassium: 4 mEq/L (ref 3.5–5.1)
Sodium: 140 mEq/L (ref 135–145)

## 2010-11-01 LAB — PROTIME-INR
INR: 1.96 — ABNORMAL HIGH (ref 0.00–1.49)
Prothrombin Time: 22.7 seconds — ABNORMAL HIGH (ref 11.6–15.2)

## 2010-11-01 LAB — CBC
Hemoglobin: 9.2 g/dL — ABNORMAL LOW (ref 12.0–15.0)
MCH: 28.5 pg (ref 26.0–34.0)
Platelets: 204 10*3/uL (ref 150–400)
RBC: 3.23 MIL/uL — ABNORMAL LOW (ref 3.87–5.11)
WBC: 16 10*3/uL — ABNORMAL HIGH (ref 4.0–10.5)

## 2010-11-01 LAB — URINE CULTURE
Colony Count: 75000
Special Requests: NEGATIVE

## 2010-11-01 LAB — PRO B NATRIURETIC PEPTIDE: Pro B Natriuretic peptide (BNP): 6271 pg/mL — ABNORMAL HIGH (ref 0–125)

## 2010-11-02 LAB — BASIC METABOLIC PANEL
BUN: 34 mg/dL — ABNORMAL HIGH (ref 6–23)
CO2: 25 mEq/L (ref 19–32)
Chloride: 100 mEq/L (ref 96–112)
Creatinine, Ser: 1.19 mg/dL — ABNORMAL HIGH (ref 0.50–1.10)

## 2010-11-02 LAB — DIFFERENTIAL
Basophils Relative: 0 % (ref 0–1)
Eosinophils Relative: 0 % (ref 0–5)
Lymphocytes Relative: 5 % — ABNORMAL LOW (ref 12–46)
Monocytes Absolute: 1.4 10*3/uL — ABNORMAL HIGH (ref 0.1–1.0)
Monocytes Relative: 9 % (ref 3–12)
Neutro Abs: 13.3 10*3/uL — ABNORMAL HIGH (ref 1.7–7.7)

## 2010-11-02 LAB — CBC
HCT: 32 % — ABNORMAL LOW (ref 36.0–46.0)
Hemoglobin: 10.7 g/dL — ABNORMAL LOW (ref 12.0–15.0)
MCH: 29.1 pg (ref 26.0–34.0)
MCHC: 33.4 g/dL (ref 30.0–36.0)
RDW: 16.2 % — ABNORMAL HIGH (ref 11.5–15.5)

## 2010-11-03 LAB — BASIC METABOLIC PANEL
BUN: 36 mg/dL — ABNORMAL HIGH (ref 6–23)
CO2: 32 mEq/L (ref 19–32)
Calcium: 8.1 mg/dL — ABNORMAL LOW (ref 8.4–10.5)
Creatinine, Ser: 1.25 mg/dL — ABNORMAL HIGH (ref 0.50–1.10)
Glucose, Bld: 76 mg/dL (ref 70–99)

## 2010-11-03 LAB — CBC
MCH: 28.4 pg (ref 26.0–34.0)
MCHC: 32.6 g/dL (ref 30.0–36.0)
MCV: 87.2 fL (ref 78.0–100.0)
Platelets: 191 10*3/uL (ref 150–400)
RDW: 16.1 % — ABNORMAL HIGH (ref 11.5–15.5)

## 2010-11-04 LAB — CBC
Hemoglobin: 10.7 g/dL — ABNORMAL LOW (ref 12.0–15.0)
MCH: 27.8 pg (ref 26.0–34.0)
MCHC: 31.2 g/dL (ref 30.0–36.0)

## 2010-11-04 LAB — BASIC METABOLIC PANEL
BUN: 34 mg/dL — ABNORMAL HIGH (ref 6–23)
Calcium: 8.1 mg/dL — ABNORMAL LOW (ref 8.4–10.5)
GFR calc non Af Amer: 47 mL/min — ABNORMAL LOW (ref >60)
Glucose, Bld: 79 mg/dL (ref 70–99)
Sodium: 138 mEq/L (ref 135–145)

## 2010-11-04 LAB — DIFFERENTIAL
Basophils Relative: 0 % (ref 0–1)
Monocytes Absolute: 1.3 10*3/uL — ABNORMAL HIGH (ref 0.1–1.0)
Monocytes Relative: 10 % (ref 3–12)
Neutro Abs: 9.7 10*3/uL — ABNORMAL HIGH (ref 1.7–7.7)

## 2010-11-04 LAB — PROTIME-INR: INR: 2.07 — ABNORMAL HIGH (ref 0.00–1.49)

## 2010-11-04 NOTE — Consult Note (Signed)
NAMEELASHA, TESS NO.:  [id number]  MEDICAL RECORD NO.:  [id number]  LOCATION:  1241                         FACILITY:  [facility]  PHYSICIAN:  Sandria Bales. Ezzard Standing, M.D.  DATE OF BIRTH:  1933-12-09  DATE OF CONSULTATION:  10/25/2010                                CONSULTATION   HISTORY OF PRESENT ILLNESS:  This is a 77 year old black female who sees [facility] as her primary medical doctor, and Dr. Verdis Prime, from a heart standpoint.  She was at the cancer center on October 24, 2010, when blood work drawn showed she was hyperkalemic.  She was sent to the emergency room where she was found to have a creatinine of 3.6 and was admitted for further management.  ADMITTING DIAGNOSES:  On October 24, 2010 were: 1. Hyperkalemia. 2. Acute renal failure. 3. Chronic venous stasis. 4. Atrial fibrillation.  On admission, she is also found to be over anticoagulated (INR 6.3) and in probable congestive heart failure with her atrial fibrillation.  She complained of some abdominal pain today.  KUB ordered by Dr. _____??____ shows diffuse small bowel and large bowel distention consistent with an ileus.  An ultrasound of her abdomen done today showed contracted gallbladder with a stone in the neck of her gallbladder, multiple liver cysts, and large IVC consistent with probable right heart failure. I was called to evaluate her from a GI standpoint.  Per the patient's history, she has no history of peptic ulcer disease or liver disease.  By her personal history, she did not know she had gallstones.  There is a lipase done today, which was 23.  She has no history of pancreatitis or colon disease.  She has a well-healed lower midline incision.  She thinks it was from appendicitis.  She also says she has had a tubal ligation.  Tonight in talking to me she says she is really not having that much way of the abdominal pain.  PAST MEDICAL HISTORY:  She has a history  of: 1. Diastolic dysfunction with congestive heart failure. 2. She is in chronic atrial fibrillation, on anticoagulation for this. 3. She has a history of mitral regurgitation, had a mitral valve     repair, but apparently still has some leaks from this according to     an echo, I think performed or submitted by Dr. Katrinka Blazing. 4. Hyperlipidemia. 5. History of hypertension. 6. She has chronic venous insufficiency and stasis of her lower     extremities. 7. She has had prior foot surgeries with cellulitis done by Dr. Myrtie Neither.  ALLERGIES:  She has no known drug allergies.  She was also recently diagnosed with a right breast cancer. There is a biopsy report from October 10, 2010, which shows invasive ductal carcinoma, ER receptor 0%, PR receptor 0%, proliferation marker is 90%, and there was no amplification of HER2/neu.  I do not know who she has seen for Medical Oncology at this time of my dictation.  PHYSICAL EXAMINATION:  VITAL SIGNS:  Her temperature is 97.  Her pulse is about 90, blood pressure 123/75. HEENT:  She is an older woman.  She talks well, but she  really cannot give a very good history. NECK:  Supple.  She has jugular venous distention consistent with right heart failure.  She has no mass or tenderness in her neck. LYMPH NODES:  I feel no cervical, supraclavicular, or axillary adenopathy. BREASTS:  Her right breast, she has a palpable mass about the 12 o'clock position consistent with, I think, which is her primary breast cancer. LUNGS:  Clear to auscultation with symmetric breath sounds. HEART:  She has a well-healed median sternotomy scar.  She has a fairly lateral to 3-4/6 systolic murmur. ABDOMEN:  She has a slightly distended abdomen.  She has a well-healed lower midline incision, which I think are hernia defects in this old lower incision, but nothing is incarcerated.  She has no localized tenderness.  She has active bowel sounds.  No guarding or  mass. EXTREMITIES:  She has venous stasis changes in her lower extremities, but she moves her upper and lower extremities without difficulty.  LABORATORY DATA:  Her lactic acid was 0.8.  Her lipase was 23.  Her basic metabolic panel with a sodium of 140, potassium 5.1, glucose of 110, BUN of 36, creatinine of 3.03 and these labs were all done today. Her CBC shows a hemoglobin of 8.9, hematocrit 28, white blood count of 9300.  Her TSH was 1.993 in the normal range.  Her serum cholesterol was 126.  Her PT was 56 with an INR of 6.34.  IMPRESSION: 1. Abdominal distention, ileus probably secondary to acute medical     illness with congestive heart failure, hyperkalemia, and acute     renal failure.  I do not see any surgical cause for any of bowel     problems at this time. 2. Asymptomatic gallstones. 3. Lower abdominal hernia defect.  I think she has some persistent     symptoms.  A CT scan could be done, but I do not think that she is     candidate for surgery at this time for anything anyway. 4. Ileus  secondary to multiple medical problems. 5. Congestive heart failure. 6. Acute renal failure with a creatinine 3.28. 7. Over anticoagulated with a PT of 56 and INR of 6.34. 8. Hyperkalemia, though her potassium is down to 5.3 today. 9. Mitral valve disease with regurgitation. 10.Right breast cancer.  There is an MRI dated October 15, 2010, which     shows a 1.4 irregular mass in the upper     middle third consistent with a right breast cancer. 11.History of mitral valve replacement 12.Chronic venous stasis of her lower extremities.   Sandria Bales. Ezzard Standing, M.D., FACS   DHN/MEDQ  D:  10/25/2010  T:  10/26/2010  Job:  [id number]  cc:   [name]. [name], M.D. [address] [place] [address]  [facility]  Dr. Garnette Scheuermann  Electronically Signed by Ovidio Kin M.D. on 11/04/2010 01:58:25 PM

## 2010-11-05 ENCOUNTER — Telehealth (INDEPENDENT_AMBULATORY_CARE_PROVIDER_SITE_OTHER): Payer: Self-pay | Admitting: General Surgery

## 2010-11-05 LAB — CBC
HCT: 35.5 % — ABNORMAL LOW (ref 36.0–46.0)
MCHC: 31.5 g/dL (ref 30.0–36.0)
Platelets: 191 10*3/uL (ref 150–400)
RDW: 15.4 % (ref 11.5–15.5)
WBC: 10 10*3/uL (ref 4.0–10.5)

## 2010-11-05 LAB — DIFFERENTIAL
Basophils Absolute: 0.1 10*3/uL (ref 0.0–0.1)
Eosinophils Absolute: 0.1 10*3/uL (ref 0.0–0.7)
Lymphocytes Relative: 16 % (ref 12–46)
Monocytes Absolute: 1.3 10*3/uL — ABNORMAL HIGH (ref 0.1–1.0)
Neutrophils Relative %: 69 % (ref 43–77)

## 2010-11-05 LAB — BASIC METABOLIC PANEL
BUN: 36 mg/dL — ABNORMAL HIGH (ref 6–23)
CO2: 34 mEq/L — ABNORMAL HIGH (ref 19–32)
Chloride: 92 mEq/L — ABNORMAL LOW (ref 96–112)
Glucose, Bld: 92 mg/dL (ref 70–99)
Potassium: 4.2 mEq/L (ref 3.5–5.1)

## 2010-11-10 NOTE — Discharge Summary (Signed)
  NAMENIYATI, HEINKE NO.:  [id number]  MEDICAL RECORD NO.:  [id number]  LOCATION:  1427                         FACILITY:  [facility]  PHYSICIAN:  Kela Millin, M.D.DATE OF BIRTH:  [birth year]  DATE OF ADMISSION:  10/24/2010 DATE OF DISCHARGE:  11/05/2010                        DISCHARGE SUMMARY - REFERRING   ADDENDUM: Addendum to the discharge summary dictated on November 04, 2010, by Dr. Ramiro Harvest.  HOSPITAL COURSE:  Her stay overnight was uneventful.  Please see the discharge summary dictated by Dr. Ramiro Harvest on November 03, 2008, for details.  Also please see the discharge summary of October 29, 2010, by Dr. Waymon Amato.  Her heart rate remained controlled overnight in the 70s to 90s.  Discussed the patient with Dr. Katrinka Blazing and he agrees that she will be discharged home for outpatient followup and he will coordinate when her Coumadin needs to be held preoperatively with surgery.  DISCHARGE MEDICATIONS: 1. Aspirin 81 mg p.o. daily. 2. Diltiazem 180 mg p.o. daily. 3. Lasix 80 mg p.o. q.a.m. and 40 mg q.p.m. 4. Metoprolol XL 100 mg p.o. b.i.d. 5. Calcium carbonate with vitamin D 2 tablets daily. 6. Clonidine 0.1 mg p.o. b.i.d. 7. Omeprazole 20 mg p.o. b.i.d. 8. KCl 20 mEq p.o. daily. 9. Zocor 40 mg p.o. q.p.m. 10.Travatan ophthalmic 0.004% 1 drop in both eyes q.h.s. 11.Coumadin 7.5 mg p.o. daily.  PT/INR to be done on November 07, 2010 and     then as directed per MD  DISCONTINUED MEDICATIONS THIS HOSPITAL STAY:  Eplerenone, amlodipine, lisinopril, clarithromycin, and amoxicillin.  FOLLOWUP CARE: 1. Primary care physician at [facility] for PT/INR on     November 07, 2010, and in 1 to 2 weeks, call for appointment. 2. Dr. [name], call for an appointment on discharge. 3. Dr. Darnelle Catalan, call for appointment upon discharge. 4. Dr. Verdis Prime as scheduled on November 12, 2010. 5. Discharge condition - improved/stable.     Kela Millin,  M.D.     ACV/MEDQ  D:  11/05/2010  T:  11/05/2010  Job:  [id number]  cc:   Lyn Records, M.D. Fax: [phone]  [facility]  Lowella Dell, M.D. Fax: [phone]  [name]. [name], M.D. [address], [address] [place] [address]  Electronically Signed by Donnalee Curry M.D. on 11/10/2010 07:42:16 AM

## 2010-11-11 ENCOUNTER — Telehealth (INDEPENDENT_AMBULATORY_CARE_PROVIDER_SITE_OTHER): Payer: Self-pay

## 2010-11-11 NOTE — Telephone Encounter (Signed)
Patient is scheduled for surgery on July 20th.  She called stating she took her Coumadin today.  I notified Dr [name] who said take no more after that dose and she will get her PT/INR on arrival Friday.  If her blood is abnormal, he may not be able to do surgery.  Patient understands.  She was not instructed to stop her Coumadin per Dr Darnelle Catalan.  The patient is seeing Dr Katrinka Blazing tomorrow.

## 2010-11-13 NOTE — Discharge Summary (Signed)
[name], [name] NO.:  [id number]  MEDICAL RECORD NO.:  [id number]  LOCATION:  1427                         FACILITY:  [facility]  PHYSICIAN:  Ramiro Harvest, MD    DATE OF BIRTH:  July 13, 1933  DATE OF ADMISSION:  10/24/2010 DATE OF DISCHARGE:                        DISCHARGE SUMMARY    ANTICIPATED DATE OF DISCHARGE:  11/05/2010.  PRIMARY CARE PHYSICIAN:  [facility]  GENERAL SURGEON:  [name]. [name], M.D.  ONCOLOGIST:  Lowella Dell, M.D.  CARDIOLOGIST:  Lyn Records, M.D.  This discharge summary covers the periods between October 30, 2010 through November 04, 2010.  For the rest of the hospitalization, please see prior discharge summary dictated per Dr. Waymon Amato of job [id number].  DISCHARGE DIAGNOSES: 1. Acute on chronic diastolic heart failure, improved. 2. Atrial fibrillation. 3. Acute gouty arthropathy, resolved. 4. Acute on chronic kidney disease, resolved. 5. Hyperkalemia, resolved. 6. Mild anion gap metabolic acidosis, resolved. 7. Chronic anemia, stable. 8. Coagulopathy secondary to Coumadin, resolved. 9. Hypertension. 10.Chronic severe lower extremity edema secondary to pulmonary     hypertension and venous insufficiency. 11.Mitral valve disease status post mitral valve repair in 2001. 12.Abdominal pain secondary to ileus, resolved. 13.Cholelithiasis. 14.Probable hernia defect in the lower abdominal incision outpatient     followup. 15.Recently diagnosed clinical T1c N0 grade 3 triple negative breast     cancer of the right breast, for outpatient surgery and radiation     therapy.  Discharge medications will be dictated per discharging physician.  DISPOSITION AND FOLLOWUP:  The patient will be discharged home with home health PT, OT.  The patient is to follow up with Cardiology as scheduled November 12, 2010, at which point in time her rate controlling medications for her atrial fibrillation will be adjusted per her  cardiologist, also her anticoagulation will be coordinated between her cardiologist and the General Surgeon as to when to discontinue her Coumadin in perforation for her breast surgery.  Cardiologist will also follow up on the patient's acute on chronic diastolic heart failure.  The patient is to also follow up with her General Surgeon, Dr. Claud Kelp to reschedule for her breast surgery.  The patient is also to follow up with her PCP as an outpatient.  CONSULTATIONS DONE:  Please see prior discharge summary dictated per Dr. Waymon Amato of job [id number].  PROCEDURES PERFORMED:  During the periods of October 30, 2010 through November 04, 2010, a chest x-ray was done on November 01, 2010 that showed grossly unchanged enlarged cardiac silhouette and pulmonary venous congestion without frank pulmonary edema.  HOSPITAL COURSE:  During the periods of October 30, 2010 through November 04, 2010. 1. Acute on chronic diastolic heart failure.  During this     hospitalization, the patient had initially presented with an acute     on chronic kidney disease, which was felt to be secondary to     aggressive diuresis.  The patient's Lasix had been held and she was     followed and monitored.  Her renal function improved on a daily     basis and had resolved.  The patient then started to develop a     little bit of a  cough.  She was seen by Cardiology and it was felt     that the patient was likely in acute on chronic diastolic heart     failure after her diuretics were held.  The patient's diuretics     were reinstituted and doses adjusted per her cardiologist.  The     patient had good urinary output.  The patient improved clinically     and was in stable and improved condition as of November 04, 2010.  The     patient is currently on Lasix 80 mg in the morning and 40 mg at     night with further adjustments being made per her cardiologist.     The patient is currently stable and improved condition and will     follow up as  outpatient with her cardiologist. 2. Atrial fibrillation.  The patient during this hospitalization was     noted to be in Afib.  Her rate was uncontrolled.  The patient's     heart rate went up at least into the 140s on ambulation with her     physical therapist.  Adjustments were made to the patient's rate     controlling medications.  Her Toprol dose was initially increased.     Her rate was still not well-controlled and digoxin and amiodarone     were also on board.  Digoxin was added and subsequently     discontinued per her cardiologist.  Toprol dose has been adjusted     to 100 twice daily.  She has been also started on diltiazem 180 mg     daily for rate control.  Her Coumadin is currently in therapeutic     range.  Her cardiologist is following the patient and prior to     discharge, final rate controlling medication doses will be     recommended per her cardiologist.  The patient will subsequently     follow up with her cardiologist as an outpatient for further rate     control as needed and also to coordinate when to discontinue her     Coumadin in preparation for her breast surgery.  The patient is     currently in a stable and improved condition and is asymptomatic as     of November 04, 2010. 3. Acute gouty arthropathy.  During the hospitalization, the patient     was noted to be in acute gouty arthropathy.  The patient was placed     on oral steroid taper with resolution of her acute gout flare. 4. Breast cancer, this remains stable.  The patient was seen by Dr.     Claud Kelp on November 03, 2010, it was felt that the patient will     be scheduled for elective outpatient right partial mastectomy with     an needle localization per her General Surgeon.  It was felt the     patient will need to be off her Coumadin for at least 5-6 days as     well as off aspirin for 2-3 days preoperatively.  The question was     to whether the patient will be bridged with Lovenox or heparin.      Once her Coumadin has been discontinued, which will be deferred to     her cardiologist.  Her cardiologist has cleared the patient for     breast surgery.  The patient is currently on Coumadin with a     therapeutic INR.  The  patient will follow up with her cardiologist     as an outpatient and will be coordinated between her cardiologist     and General Surgeon as to the appropriate time to discontinue her     Coumadin in preparation for surgery as well as aspirin and will be     decided per her cardiologist as to whether the patient needs any     bridging with heparin or Lovenox, which will be deferred to her     cardiologist.  The patient's     cardiologist has cleared the patient for her surgery.  The patient     is currently in stable condition.  For the rest of the     hospitalization, please see prior discharge summary dictated per     Dr. Waymon Amato of job [id number].  It has been a pleasure taking care of     Ms. Vela Prose.     Ramiro Harvest, MD     DT/MEDQ  D:  11/04/2010  T:  11/04/2010  Job:  [id number]  cc:   [facility] Fax: [phone]  Lyn Records, M.D. Fax: [phone]  [name]. [name], M.D. [address] [place] [address]  Lowella Dell, M.D. Fax: [phone] Signed by Ramiro Harvest MD on 11/13/2010 06:33:06 PM

## 2010-11-14 ENCOUNTER — Encounter (HOSPITAL_COMMUNITY)
Admission: RE | Admit: 2010-11-14 | Discharge: 2010-11-14 | Disposition: A | Discharge status: Home / Self Care | Payer: Medicare Other | Source: Ambulatory Visit | Attending: General Surgery | Admitting: General Surgery

## 2010-11-14 ENCOUNTER — Telehealth (INDEPENDENT_AMBULATORY_CARE_PROVIDER_SITE_OTHER): Payer: Self-pay

## 2010-11-14 LAB — URINALYSIS, ROUTINE W REFLEX MICROSCOPIC
Bilirubin Urine: NEGATIVE
Hgb urine dipstick: NEGATIVE
Ketones, ur: NEGATIVE mg/dL
Specific Gravity, Urine: 1.017 (ref 1.005–1.030)
Urobilinogen, UA: 0.2 mg/dL (ref 0.0–1.0)

## 2010-11-14 LAB — CBC
HCT: 31.6 % — ABNORMAL LOW (ref 36.0–46.0)
Hemoglobin: 10.3 g/dL — ABNORMAL LOW (ref 12.0–15.0)
MCH: 28.6 pg (ref 26.0–34.0)
MCHC: 32.6 g/dL (ref 30.0–36.0)

## 2010-11-14 LAB — DIFFERENTIAL
Basophils Relative: 0 % (ref 0–1)
Eosinophils Relative: 1 % (ref 0–5)
Lymphocytes Relative: 11 % — ABNORMAL LOW (ref 12–46)
Monocytes Absolute: 1.1 10*3/uL — ABNORMAL HIGH (ref 0.1–1.0)
Monocytes Relative: 7 % (ref 3–12)
Neutro Abs: 11.8 10*3/uL — ABNORMAL HIGH (ref 1.7–7.7)

## 2010-11-14 LAB — COMPREHENSIVE METABOLIC PANEL
BUN: 47 mg/dL — ABNORMAL HIGH (ref 6–23)
Calcium: 8.8 mg/dL (ref 8.4–10.5)
GFR calc Af Amer: 56 mL/min — ABNORMAL LOW (ref >60)
Glucose, Bld: 80 mg/dL (ref 70–99)
Total Protein: 7.6 g/dL (ref 6.0–8.3)

## 2010-11-14 LAB — SURGICAL PCR SCREEN
MRSA, PCR: NEGATIVE
Staphylococcus aureus: NEGATIVE

## 2010-11-14 LAB — PROTIME-INR: INR: 1.99 — ABNORMAL HIGH (ref 0.00–1.49)

## 2010-11-14 NOTE — Telephone Encounter (Signed)
Per Dr Jacinto Halim request pt notified via phone that her lab values are still abnormal and we may have to cx r/s surgery. Pt states she has stopped her coumadin and that Riki Rusk at Dr Lonn Georgia office has placed pt on vit k and green leafy veggies to try to improve her coag values. Pt advised she is to still arrive at hospital pre op and will have stat labs. If the coag labs are not better pt.s surgery will have to be postponed. Pt states she understands.

## 2010-11-15 ENCOUNTER — Ambulatory Visit (HOSPITAL_COMMUNITY)
Admission: RE | Admit: 2010-11-15 | Discharge: 2010-11-15 | Disposition: A | Discharge status: Home / Self Care | Payer: Medicare Other | Source: Ambulatory Visit | Attending: General Surgery | Admitting: General Surgery

## 2010-11-15 ENCOUNTER — Other Ambulatory Visit (INDEPENDENT_AMBULATORY_CARE_PROVIDER_SITE_OTHER): Payer: Self-pay | Admitting: General Surgery

## 2010-11-15 DIAGNOSIS — M109 Gout, unspecified: Secondary | ICD-10-CM | POA: Insufficient documentation

## 2010-11-15 DIAGNOSIS — I10 Essential (primary) hypertension: Secondary | ICD-10-CM | POA: Insufficient documentation

## 2010-11-15 DIAGNOSIS — D0590 Unspecified type of carcinoma in situ of unspecified breast: Secondary | ICD-10-CM

## 2010-11-15 DIAGNOSIS — I359 Nonrheumatic aortic valve disorder, unspecified: Secondary | ICD-10-CM | POA: Insufficient documentation

## 2010-11-15 DIAGNOSIS — Z01812 Encounter for preprocedural laboratory examination: Secondary | ICD-10-CM | POA: Insufficient documentation

## 2010-11-15 DIAGNOSIS — C50419 Malignant neoplasm of upper-outer quadrant of unspecified female breast: Secondary | ICD-10-CM | POA: Insufficient documentation

## 2010-11-15 DIAGNOSIS — Z79899 Other long term (current) drug therapy: Secondary | ICD-10-CM | POA: Insufficient documentation

## 2010-11-15 LAB — PROTIME-INR
INR: 1.32 (ref 0.00–1.49)
Prothrombin Time: 16.6 seconds — ABNORMAL HIGH (ref 11.6–15.2)

## 2010-11-18 NOTE — Op Note (Signed)
NAMESAVVY, PEETERS NO.:  [id number]  MEDICAL RECORD NO.:  [id number]  LOCATION:  SDSC                         FACILITY:  [facility]  PHYSICIAN:  [name]. [name], M.D.DATE OF BIRTH:  February 06, 1934  DATE OF PROCEDURE:  11/16/2010 DATE OF DISCHARGE:  11/15/2010                              OPERATIVE REPORT   PREOPERATIVE DIAGNOSIS:  Invasive ductal carcinoma of right breast, upper outer quadrant, clinical stage T1c, N0.  POSTOPERATIVE DIAGNOSIS:  Invasive ductal carcinoma of right breast, upper outer quadrant, clinical stage T1c, N0.  OPERATION PERFORMED:  Right partial mastectomy with needle localization.  SURGEON:  [name]. [name], MD  OPERATIVE INDICATIONS:  This is a 77 year old African American female with numerous health problems including chronic atrial fibrillation on Coumadin, aortic valve replacement, renal insufficiency, and recent hospitalization for congestive heart failure.  Recent mammograms showed a solid 1.4-cm mass in the right breast upper central area at about the 11 o'clock position.  She had mammograms and MRI and biopsy and this showed invasive ductal carcinoma, high grade, and this was a triple negative tumor.  She has seen me, Dr. Darnelle Catalan, Dr. Chipper Herb, and Dr. Verdis Prime, her cardiologist to discuss the management of this. Dr. Darnelle Catalan does not feel that she needs to have a Port-A-Cath as he does not think she will tolerate cytotoxic chemotherapy.  He does not feel that she needs any lymph node biopsy and we will simply plan postop radiation therapy to the breast and axilla.  This has been discussed with the patient and her family.  She is brought to the operating room for right partial mastectomy.  OPERATIVE TECHNIQUE:  The patient underwent needle localization by Dr. Derinda Late at [facility] today.  The wire entered from the medial aspect and was directed superolaterally.  The wire placement looked good.  The patient  was brought to the operating room at [facility]. General anesthesia was induced.  Intravenous antibiotics were given. The right breast was prepped and draped in a sterile fashion.  Surgical time-out was held identifying correct patient, correct procedure, and correct site.  A 0.5% Marcaine with epinephrine was used as a local infiltration anesthetic.  I used a marking pen and I marked a curvilinear elliptical incision in the upper breast extending a little bit more laterally than medially. The incision was made and dissection was carried down into the breast tissue widely around the area of thickening and the wire.  The specimen was removed and marked with the 6-color margin marker kit.  Specimen mammogram was performed and the localization looked good.  The wire and the marker clip appeared to be in the center of the specimen.  Specimen was then sent for routine histology.  The wound was copiously irrigated with saline.  Hemostasis was excellent and achieved with electrocautery. The breast tissue was closed in 2 layers with interrupted 3-0 Vicryl sutures and the skin was closed with running subcuticular suture of 4-0 Monocryl and Dermabond.  A breast binder was placed.  The patient was awakened and taken to the recovery room in stable condition.  Estimated blood loss was about 10-20 mL.  Complications were none.  Sponge, needle, and instrument counts  were correct.     [name]. [name], M.D.     HMI/MEDQ  D:  11/15/2010  T:  11/16/2010  Job:  [id number]  cc:   Lowella Dell, M.D. Jeralyn Ruths, MD Lyn Records, M.D. Maryln Gottron, M.D.  Electronically Signed by Claud Kelp M.D. on 11/18/2010 05:41:21 PM

## 2010-11-19 NOTE — Discharge Summary (Signed)
[name], [name] NO.:  [id number]  MEDICAL RECORD NO.:  [id number]  LOCATION:  1427                         FACILITY:  [facility]  PHYSICIAN:  Marcellus Scott, MD     DATE OF BIRTH:  Feb 07, 1934  DATE OF ADMISSION:  10/24/2010 DATE OF DISCHARGE:                        DISCHARGE SUMMARY - REFERRING   PRIMARY CARE PHYSICIAN:  [facility].  SURGEON:  [name]. [name], MD  ONCOLOGIST:  Lowella Dell, MD  DISCHARGE DIAGNOSES: 1. Acute-on-chronic kidney disease.  Acute renal failure, resolved. 2. Hyperkalemia, resolved. 3. Mild anion gap metabolic acidosis, resolved. 4. Chronic anemia, stable. 5. Coagulopathy secondary to Coumadin.  Improved.  No bleeding. 6. Atrial fibrillation with controlled ventricular rate. 7. Chronic diastolic congestive heart failure, compensated. 8. Hypertension. 9. Chronic severe lower extremity edema secondary to pulmonary     hypertension and venous insufficiency. 10.Mitral valve disease, status post mitral valve repair in 2001. 11.Abdominal pain secondary to ileus, resolved. 12.Gallstones. 13.Probable hernia defect in the lower abdominal incision, outpatient     followup. 14.Acute gouty arthritis of the right knee, improved. 15.Recently diagnosed clinical T1c N0, grade III triple negative     breast cancer of the right breast, for outpatient surgery and     radiation.  DISCHARGE MEDICATIONS:  To be dictated by discharging MD.  IMAGING: 1. Abdominal x-ray on October 25, 2010.  Impression:  Mild diffuse small     and large bowel distention, most consistent with an ileus.  Early     distal obstruction could have this appearance and followup was     recommended. 2. Complete abdominal ultrasound on October 25, 2010.  Impression:  (a)     contracted gallbladder with nonmobile stone in the neck.     Gallbladder wall thickness at the upper limits of normal.  No     sonographic Murphy sign elicited.  No pericholecystic  fluid.  (b)     Multicystic liver disease.  No intra or extrahepatic biliary ductal     dilatation.  C is enlarged.  IVC and proximal hepatic veins may     indicate a degree of right heart failure. 3. Left shoulder x-ray.  Impression:  Degenerative changes without     acute bony findings. 4. Chest x-ray, October 24, 2010.  Impression:  No acute cardiopulmonary     disease. 5. A 2-D echocardiogram on October 25, 2010.  Conclusion:  Left ventricle     systolic function 60% to 65%.  Ventricular septum showed paradox     motion.  There was diastolic flattening.  Moderate to severe aortic     stenosis with moderate aortic regurgitation.  Prior mitral valve     repair with mild-to-moderate mitral valve regurgitation.  The right     ventricle size was moderately dilated.  Severe tricuspid     regurgitation.  Pulmonary artery systolic pressure was 53 mmHg.  CONSULTATIONS: 1. Surgery, Sandria Bales. Ezzard Standing, M.D. 2. Oncology, Lowella Dell, M.D. 3. Cardiology, Lyn Records, M.D.  DIET:  Heart healthy diet.  ACTIVITY:  Out of bed with assistance and increase activity gradually.  COMPLAINTS TODAY:  The patient today indicates that her right knee pain, which she  had yesterday is resolved.  She denies any further abdominal pain.  She is tolerating diet.  PHYSICAL EXAMINATION:  GENERAL:  The patient is in no obvious distress. VITAL SIGNS:  Temperature 98 degrees Fahrenheit, pulse 69 per minute and regular, respirations 17 per minute, blood pressure 130/77 mmHg and saturating at 92% on room air. RESPIRATORY:  Clear. CARDIOVASCULAR:  S1 and S2 heard, irregular.  No JVD. ABDOMEN:  Nondistended, nontender, soft and bowel sounds present. CENTRAL NERVOUS SYSTEM:  The patient is awake, alert and oriented x3 with no focal neurological deficits. EXTREMITIES:  With bilateral 2+ pitting edema of the legs.  Right knee has no tenderness.  She has mild right knee swelling and mild warmth.  HOSPITAL COURSE:   [name] is a 77 year old African American female patient with history of chronic diastolic congestive heart failure, severe pulmonary hypertension, atrial fibrillation, on chronic Coumadin, status post mitral valve repair and chronic venous insufficiency who was recently diagnosed to have breast cancer and is awaiting surgery. On the day of admission, the patient went to see her oncologist and was found to have markedly abnormal labs including hyperkalemia and acute renal failure.  She was advised to come to the  emergency room for admission for evaluation and management.  She also complained of generalized fatigue.  1. Acute-on-chronic kidney disease.  The patient's baseline creatinine     is 1.4 to 1.6.  Her acute renal failure was possibly secondary to     diuretics including Lasix and eplerenone.  These medications were     obviously held.  The patient was gently hydrated with IV fluids.     Renal obstruction was ruled out.  With these measures, the patient     has made a gradual recovery with resolution of her acute renal     failure and creatinine almost at baseline.  She will obviously have     to reinitiate diuretics soon for her chronic diastolic right heart     failure.  Await dosing recommendations by her primary cardiologist     who continued to see her in the hospital.  Her basic metabolic     panel will also have to be closely followed while she is on these     diuretics. 2. Hyperkalemia secondary to acute renal failure and potassium     supplements.  Recently her Lasix dose had been cut down from 160     per day to 20 mg daily.  She was also asked to take more potassium     supplements for leg cramps.  These probably contributed to her     hyperkalemia.  This was treated with Kayexalate and this has     resolved.  She had mild hypokalemia yesterday, which is better     today.  Again, this will have to be closely monitored once she is     started on her diuretics. 3. Mild  anion gap metabolic acidosis secondary to acute renal failure.     Resolved. 4. Anemia, which is possibly from chronic disease or chronic kidney     disease.  This is stable. 5. Coagulopathy secondary to Coumadin.  She was not bleeding.  This     was not reversed and this has gradually trended down and Pharmacy     is adjusting her Coumadin.  This will need to be followed up also     as an outpatient while she is on Coumadin. 6. Atrial fibrillation with controlled ventricular rate.  She is     continued on her home medications. 7. Chronic diastolic congestive heart failure.  This is compensated.     Lasix and her eplerenone were held.  Prior to discharge, she will     be initiated on Lasix as per the cardiology recommendations     probably at a higher dose than the 20 mg per day that she was on     prior to admission.  At some point, she will also need to     reinitiate her lisinopril and need close followup as an outpatient. 8. Hypertension.  Reasonably controlled. 9. Abdominal pain secondary to ileus.  Surgery was consulted.  She was     managed conservatively.  This has improved and her diet has been     advanced.  Surgeons recommend an elective outpatient followup CT     abdomen with contrast.  However, given her chronic kidney disease,     it is probably better to do it without contrast to evaluate for her     incisional hernia. 10.Recently diagnosed right breast cancer.  The patient is to follow     up with her surgeon as an outpatient to reschedule her surgery. 11.Acute gouty arthritis of her right knee.  The patient was started     on oral prednisone yesterday.  Clinically she has done very well.     Recommend tapering her prednisone after the first 2 days over a 5-     day period.  DISPOSITION:  The patient was seen by Physical Therapy/Occupational Therapy yesterday and they had recommended a skilled nursing facility placement secondary to her inability to ambulate.  This  was, however, in the context of right knee pain.  She declines going to a skilled facility and indicates that she will go home and her family will be able to assist her.  She apparently also takes care of her 82 year old mother at home.  At this time, the case manager and social worker are trying to discuss with her family to see if truly they will be able to support her.  The patient should be able to discharge in the next 24 hours pending clarification of her home environment and the patient's agreeability to go home and Cardiology followup regarding discharging medications.  FOLLOWUP RECOMMENDATIONS: 1. The patient will need close followup of her basic metabolic panel     and INR while her diuretics and Coumadin are reinitiated. 2. Follow up with Dr. Claud Kelp to reschedule her breast cancer     surgery. 3. Follow up with her primary care physicians at [facility].  TIME SPENT:  Time taken in coordinating this discharge is 45 minutes.     Marcellus Scott, MD     AH/MEDQ  D:  10/29/2010  T:  10/29/2010  Job:  [id number]  cc:   [facility]  [place]. [name], M.D. [address], [address] [place] [address]  Lowella Dell, M.D. Fax: [phone]  Lyn Records, M.D. Fax: [phone]  Electronically Signed by Marcellus Scott MD on 11/19/2010 10:29:07 PM

## 2010-11-22 ENCOUNTER — Telehealth (INDEPENDENT_AMBULATORY_CARE_PROVIDER_SITE_OTHER): Payer: Self-pay | Admitting: General Surgery

## 2010-11-22 NOTE — Telephone Encounter (Signed)
I discussed [name]'s pathology report with her by phone today. She has an appointment to see me back in 2 weeks.

## 2010-12-06 ENCOUNTER — Encounter (INDEPENDENT_AMBULATORY_CARE_PROVIDER_SITE_OTHER): Payer: Self-pay | Admitting: General Surgery

## 2010-12-06 ENCOUNTER — Ambulatory Visit (INDEPENDENT_AMBULATORY_CARE_PROVIDER_SITE_OTHER): Payer: Medicare Other | Admitting: General Surgery

## 2010-12-06 VITALS — BP 136/78 | HR 68 | Temp 96.9°F | Ht 62.0 in | Wt 165.8 lb

## 2010-12-06 DIAGNOSIS — Z9889 Other specified postprocedural states: Secondary | ICD-10-CM

## 2010-12-06 DIAGNOSIS — C50419 Malignant neoplasm of upper-outer quadrant of unspecified female breast: Secondary | ICD-10-CM

## 2010-12-06 NOTE — Progress Notes (Signed)
Subjective:     Patient ID: [name], female   DOB: 1933/07/12, 77 y.o.   MRN: [id number]  HPI This is a 77 year old african American female who recently underwent a right breast partial mastectomy. We found she had a poorly differentiated invasive ductal carcinoma, 1.7 cm diameter, extensive lymphovascular invasion, negative margins, triple negative tumor  She is doing well following her surgery. She has no pain, no wound drainage, no fever no chills. She states that she has an appointment to see Dr. Darnelle Catalan this following Monday. She has not seen a radiation oncologist yet but I told her to anticipate that she would see a radiation oncologist next week as well. I emphasized to her that she will need radiation therapy as that on the middle part of her treatment.  Review of Systems     Objective:   Physical Exam The patient looks well and is in no distress.  The right breast shows a transverse curvilinear incision superiorly. There is no hematoma, no sign of infection, but tenderness, no swelling, no axillary mass.    Assessment:     Poorly differentiated invasive ductal carcinoma, 1.7 cm with extensive lymphovascular invasion, negative margins, triple negative tumor. This is a T1C, Nx tumor. Recovering uneventfully in the early postop period.    Plan:     She will see Dr. Darnelle Catalan next week. She will see a radiation oncologist as well.  She'll return to see me in 2-3 months for a rest exam and cancer check.

## 2010-12-06 NOTE — Patient Instructions (Signed)
Your right breast incision is healing nicely. The Dermabond glue will come off in the next 2 weeks. You may shower normally. You may wear all appear normal clothes. Keep her appointment with Dr. Darnelle Catalan next week. You will need radiation therapy. I will see her back in my office in 3 months, sooner if there are any problems.

## 2010-12-09 ENCOUNTER — Encounter (HOSPITAL_BASED_OUTPATIENT_CLINIC_OR_DEPARTMENT_OTHER): Payer: Medicare Other | Admitting: Oncology

## 2010-12-09 ENCOUNTER — Other Ambulatory Visit: Payer: Self-pay | Admitting: Oncology

## 2010-12-09 ENCOUNTER — Encounter: Payer: Medicare Other | Admitting: Oncology

## 2010-12-09 DIAGNOSIS — C50219 Malignant neoplasm of upper-inner quadrant of unspecified female breast: Secondary | ICD-10-CM

## 2010-12-09 LAB — CBC WITH DIFFERENTIAL/PLATELET
BASO%: 0.3 % (ref 0.0–2.0)
EOS%: 1.1 % (ref 0.0–7.0)
HCT: 30.2 % — ABNORMAL LOW (ref 34.8–46.6)
LYMPH%: 10.1 % — ABNORMAL LOW (ref 14.0–49.7)
MCH: 30 pg (ref 25.1–34.0)
MCHC: 33.4 g/dL (ref 31.5–36.0)
MCV: 89.8 fL (ref 79.5–101.0)
MONO#: 0.7 10*3/uL (ref 0.1–0.9)
NEUT%: 80.5 % — ABNORMAL HIGH (ref 38.4–76.8)
Platelets: 181 10*3/uL (ref 145–400)

## 2010-12-09 LAB — COMPREHENSIVE METABOLIC PANEL
ALT: 34 U/L (ref 0–35)
CO2: 24 mEq/L (ref 19–32)
Creatinine, Ser: 1.42 mg/dL — ABNORMAL HIGH (ref 0.50–1.10)
Total Bilirubin: 1.1 mg/dL (ref 0.3–1.2)

## 2010-12-11 ENCOUNTER — Encounter: Payer: Self-pay | Admitting: General Surgery

## 2010-12-18 ENCOUNTER — Inpatient Hospital Stay (HOSPITAL_COMMUNITY)
Admission: EM | Admit: 2010-12-18 | Discharge: 2010-12-22 | DRG: 682 | Disposition: A | Discharge status: Home Health | Payer: Medicare Other | Attending: Internal Medicine | Admitting: Internal Medicine

## 2010-12-18 ENCOUNTER — Inpatient Hospital Stay (HOSPITAL_COMMUNITY): Payer: Medicare Other

## 2010-12-18 DIAGNOSIS — Z954 Presence of other heart-valve replacement: Secondary | ICD-10-CM

## 2010-12-18 DIAGNOSIS — I5033 Acute on chronic diastolic (congestive) heart failure: Secondary | ICD-10-CM | POA: Diagnosis present

## 2010-12-18 DIAGNOSIS — I509 Heart failure, unspecified: Secondary | ICD-10-CM | POA: Diagnosis present

## 2010-12-18 DIAGNOSIS — T465X5A Adverse effect of other antihypertensive drugs, initial encounter: Secondary | ICD-10-CM | POA: Diagnosis present

## 2010-12-18 DIAGNOSIS — I872 Venous insufficiency (chronic) (peripheral): Secondary | ICD-10-CM | POA: Diagnosis present

## 2010-12-18 DIAGNOSIS — M109 Gout, unspecified: Secondary | ICD-10-CM | POA: Diagnosis present

## 2010-12-18 DIAGNOSIS — I498 Other specified cardiac arrhythmias: Secondary | ICD-10-CM | POA: Diagnosis present

## 2010-12-18 DIAGNOSIS — R7989 Other specified abnormal findings of blood chemistry: Secondary | ICD-10-CM | POA: Diagnosis present

## 2010-12-18 DIAGNOSIS — E785 Hyperlipidemia, unspecified: Secondary | ICD-10-CM | POA: Diagnosis present

## 2010-12-18 DIAGNOSIS — N179 Acute kidney failure, unspecified: Principal | ICD-10-CM | POA: Diagnosis present

## 2010-12-18 DIAGNOSIS — E875 Hyperkalemia: Secondary | ICD-10-CM | POA: Diagnosis present

## 2010-12-18 DIAGNOSIS — I4891 Unspecified atrial fibrillation: Secondary | ICD-10-CM | POA: Diagnosis present

## 2010-12-18 DIAGNOSIS — R791 Abnormal coagulation profile: Secondary | ICD-10-CM | POA: Diagnosis present

## 2010-12-18 DIAGNOSIS — C50919 Malignant neoplasm of unspecified site of unspecified female breast: Secondary | ICD-10-CM | POA: Diagnosis present

## 2010-12-18 DIAGNOSIS — N183 Chronic kidney disease, stage 3 unspecified: Secondary | ICD-10-CM | POA: Diagnosis present

## 2010-12-18 DIAGNOSIS — T45515A Adverse effect of anticoagulants, initial encounter: Secondary | ICD-10-CM | POA: Diagnosis present

## 2010-12-18 DIAGNOSIS — I129 Hypertensive chronic kidney disease with stage 1 through stage 4 chronic kidney disease, or unspecified chronic kidney disease: Secondary | ICD-10-CM | POA: Diagnosis present

## 2010-12-18 LAB — HEPATIC FUNCTION PANEL
ALT: 81 U/L — ABNORMAL HIGH (ref 0–35)
ALT: 81 U/L — ABNORMAL HIGH (ref 0–35)
AST: 118 U/L — ABNORMAL HIGH (ref 0–37)
AST: 110 U/L — ABNORMAL HIGH (ref 0–37)
Alkaline Phosphatase: 132 U/L — ABNORMAL HIGH (ref 39–117)
Alkaline Phosphatase: 122 U/L — ABNORMAL HIGH (ref 39–117)
Bilirubin, Direct: 0.3 mg/dL (ref 0.0–0.3)
Indirect Bilirubin: 0.3 mg/dL (ref 0.3–0.9)
Total Bilirubin: 0.6 mg/dL (ref 0.3–1.2)
Total Protein: 7.5 g/dL (ref 6.0–8.3)

## 2010-12-18 LAB — PROTIME-INR
INR: 5.2 (ref 0.00–1.49)
INR: 5.12 (ref 0.00–1.49)

## 2010-12-18 LAB — BASIC METABOLIC PANEL
BUN: 54 mg/dL — ABNORMAL HIGH (ref 6–23)
Creatinine, Ser: 3.39 mg/dL — ABNORMAL HIGH (ref 0.50–1.10)
GFR calc non Af Amer: 13 mL/min — ABNORMAL LOW (ref >60)
Glucose, Bld: 87 mg/dL (ref 70–99)
Potassium: 6.9 mEq/L (ref 3.5–5.1)

## 2010-12-18 LAB — CBC
HCT: 33.3 % — ABNORMAL LOW (ref 36.0–46.0)
Hemoglobin: 10.4 g/dL — ABNORMAL LOW (ref 12.0–15.0)
MCH: 28.2 pg (ref 26.0–34.0)
MCHC: 31.2 g/dL (ref 30.0–36.0)
MCV: 90.2 fL (ref 78.0–100.0)

## 2010-12-18 LAB — POCT I-STAT, CHEM 8
BUN: 63 mg/dL — ABNORMAL HIGH (ref 6–23)
Calcium, Ion: 1.04 mmol/L — ABNORMAL LOW (ref 1.12–1.32)
Calcium, Ion: 1.11 mmol/L — ABNORMAL LOW (ref 1.12–1.32)
Chloride: 115 mEq/L — ABNORMAL HIGH (ref 96–112)
Chloride: 113 mEq/L — ABNORMAL HIGH (ref 96–112)
Creatinine, Ser: 3.6 mg/dL — ABNORMAL HIGH (ref 0.50–1.10)
Glucose, Bld: 90 mg/dL (ref 70–99)
Glucose, Bld: 61 mg/dL — ABNORMAL LOW (ref 70–99)
HCT: 30 % — ABNORMAL LOW (ref 36.0–46.0)
Potassium: 7 mEq/L (ref 3.5–5.1)
TCO2: 21 mmol/L (ref 0–100)

## 2010-12-18 LAB — URINALYSIS, ROUTINE W REFLEX MICROSCOPIC
Glucose, UA: NEGATIVE mg/dL
Nitrite: NEGATIVE
pH: 5 (ref 5.0–8.0)

## 2010-12-18 LAB — DIFFERENTIAL
Basophils Absolute: 0 10*3/uL (ref 0.0–0.1)
Eosinophils Relative: 0 % (ref 0–5)
Lymphocytes Relative: 12 % (ref 12–46)
Lymphs Abs: 1.3 10*3/uL (ref 0.7–4.0)
Monocytes Absolute: 0.8 10*3/uL (ref 0.1–1.0)
Monocytes Relative: 7 % (ref 3–12)
Neutro Abs: 9 10*3/uL — ABNORMAL HIGH (ref 1.7–7.7)

## 2010-12-19 ENCOUNTER — Inpatient Hospital Stay (HOSPITAL_COMMUNITY): Payer: Medicare Other

## 2010-12-19 ENCOUNTER — Other Ambulatory Visit (HOSPITAL_COMMUNITY): Payer: Medicare Other

## 2010-12-19 LAB — COMPREHENSIVE METABOLIC PANEL
AST: 88 U/L — ABNORMAL HIGH (ref 0–37)
CO2: 23 mEq/L (ref 19–32)
Chloride: 103 mEq/L (ref 96–112)
Creatinine, Ser: 2.83 mg/dL — ABNORMAL HIGH (ref 0.50–1.10)
GFR calc Af Amer: 20 mL/min — ABNORMAL LOW (ref >60)
GFR calc non Af Amer: 16 mL/min — ABNORMAL LOW (ref >60)
Glucose, Bld: 102 mg/dL — ABNORMAL HIGH (ref 70–99)
Total Bilirubin: 0.5 mg/dL (ref 0.3–1.2)

## 2010-12-19 LAB — URINE CULTURE
Colony Count: NO GROWTH
Culture: NO GROWTH

## 2010-12-19 LAB — CARDIAC PANEL(CRET KIN+CKTOT+MB+TROPI)
CK, MB: 2.3 ng/mL (ref 0.3–4.0)
CK, MB: 2.6 ng/mL (ref 0.3–4.0)

## 2010-12-19 LAB — CBC
HCT: 27.2 % — ABNORMAL LOW (ref 36.0–46.0)
Hemoglobin: 8.7 g/dL — ABNORMAL LOW (ref 12.0–15.0)
MCH: 28.4 pg (ref 26.0–34.0)
MCHC: 32 g/dL (ref 30.0–36.0)
MCV: 88.9 fL (ref 78.0–100.0)
RDW: 18.2 % — ABNORMAL HIGH (ref 11.5–15.5)

## 2010-12-19 LAB — PROTIME-INR: INR: 5.07 (ref 0.00–1.49)

## 2010-12-20 ENCOUNTER — Encounter (HOSPITAL_COMMUNITY): Payer: Self-pay

## 2010-12-20 ENCOUNTER — Other Ambulatory Visit (HOSPITAL_COMMUNITY): Payer: Medicare Other

## 2010-12-20 ENCOUNTER — Inpatient Hospital Stay (HOSPITAL_COMMUNITY): Payer: Medicare Other

## 2010-12-20 LAB — CBC
HCT: 29.1 % — ABNORMAL LOW (ref 36.0–46.0)
MCV: 88.2 fL (ref 78.0–100.0)
RBC: 3.3 MIL/uL — ABNORMAL LOW (ref 3.87–5.11)
WBC: 9.6 10*3/uL (ref 4.0–10.5)

## 2010-12-20 LAB — COMPREHENSIVE METABOLIC PANEL
ALT: 68 U/L — ABNORMAL HIGH (ref 0–35)
Alkaline Phosphatase: 123 U/L — ABNORMAL HIGH (ref 39–117)
CO2: 22 mEq/L (ref 19–32)
GFR calc Af Amer: 28 mL/min — ABNORMAL LOW (ref >60)
GFR calc non Af Amer: 23 mL/min — ABNORMAL LOW (ref >60)
Glucose, Bld: 86 mg/dL (ref 70–99)
Potassium: 4.9 mEq/L (ref 3.5–5.1)
Sodium: 134 mEq/L — ABNORMAL LOW (ref 135–145)
Total Bilirubin: 0.8 mg/dL (ref 0.3–1.2)

## 2010-12-20 MED ORDER — TECHNETIUM TC 99M MEBROFENIN IV KIT
5.4000 | PACK | Freq: Once | INTRAVENOUS | Status: AC | PRN
  Administered 2010-12-20: 5.4 via INTRAVENOUS

## 2010-12-20 MED ORDER — SINCALIDE 5 MCG IJ SOLR: 0.0200 ug/kg | Freq: Once | INTRAMUSCULAR | Status: DC

## 2010-12-21 DIAGNOSIS — I498 Other specified cardiac arrhythmias: Secondary | ICD-10-CM

## 2010-12-21 LAB — CBC
MCHC: 31.5 g/dL (ref 30.0–36.0)
RDW: 17.4 % — ABNORMAL HIGH (ref 11.5–15.5)

## 2010-12-21 LAB — COMPREHENSIVE METABOLIC PANEL
ALT: 52 U/L — ABNORMAL HIGH (ref 0–35)
Albumin: 2.9 g/dL — ABNORMAL LOW (ref 3.5–5.2)
Alkaline Phosphatase: 132 U/L — ABNORMAL HIGH (ref 39–117)
Potassium: 4.3 mEq/L (ref 3.5–5.1)
Sodium: 136 mEq/L (ref 135–145)
Total Protein: 6.7 g/dL (ref 6.0–8.3)

## 2010-12-21 LAB — PROTIME-INR
INR: 3.17 — ABNORMAL HIGH (ref 0.00–1.49)
Prothrombin Time: 33 seconds — ABNORMAL HIGH (ref 11.6–15.2)

## 2010-12-21 LAB — TSH: TSH: 1.037 u[IU]/mL (ref 0.350–4.500)

## 2010-12-22 LAB — BASIC METABOLIC PANEL
CO2: 22 mEq/L (ref 19–32)
Chloride: 102 mEq/L (ref 96–112)
Creatinine, Ser: 1.13 mg/dL — ABNORMAL HIGH (ref 0.50–1.10)
Potassium: 4 mEq/L (ref 3.5–5.1)

## 2010-12-22 LAB — CBC
HCT: 30.3 % — ABNORMAL LOW (ref 36.0–46.0)
Hemoglobin: 9.9 g/dL — ABNORMAL LOW (ref 12.0–15.0)
MCH: 28.3 pg (ref 26.0–34.0)
MCHC: 32.7 g/dL (ref 30.0–36.0)

## 2010-12-22 LAB — PROTIME-INR: INR: 2.27 — ABNORMAL HIGH (ref 0.00–1.49)

## 2010-12-24 NOTE — Consult Note (Signed)
[name], [name] NO.:  [id number]  MEDICAL RECORD NO.:  [id number]  LOCATION:  1430                         FACILITY:  [facility]  PHYSICIAN:  Armanda Magic, M.D.     DATE OF BIRTH:  02/28/1934  DATE OF CONSULTATION:  12/20/2010 DATE OF DISCHARGE:                                CONSULTATION   REFERRING PHYSICIAN:  Richarda Overlie, MD  REASON FOR CONSULTATION:  Bradycardia.  HISTORY OF PRESENT ILLNESS:  This is a 77 year old African American female with a history of chronic kidney disease stage 3, diastolic heart failure, atrial fibrillation, hypertension, mitral valve replacement who was admitted with inability to urinate and decreased urine output for several days and nausea.  She was found to be hyperkalemic with acute-on- chronic renal failure.  She was also supratherapeutic on her Coumadin. 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  She has been maintained on her beta-blocker until she was admitted.  This was held.  She has been on Cardizem up until today which was held.  We are now asked to consult because of bradycardia.  PAST MEDICAL HISTORY: 1. Diastolic heart failure. 2. Atrial fibrillation. 3. Chronic kidney disease stage 3. 4. History of hyperkalemia. 5. Gouty arthropathy. 6. Chronic anemia. 7. Hypertension. 8. History of mitral valve disease, status post mitral valve repair in     2001. 9. Cholelithiasis. 10.Recent diagnosis with right breast CA, status post partial right     mastectomy followed by Dr. Darnelle Catalan. 11.Hyperlipidemia. 12.Chronic venous insufficiency.  PAST SURGICAL HISTORY:  Mitral valve replacement, history of appendectomy, multiple surgeries on her right foot and great toe, history of C-section.  SOCIAL HISTORY:  She lives with her family.  She denies  any tobacco or alcohol use.  No IV drug use.  ALLERGIES:  None.  MEDICATIONS: 1. Clonidine 0.1 mg p.o. b.i.d. 2. Calcium carbonate with vitamin D 600 mg/200 international units 1     tablet p.o. b.i.d. 3. Lasix 20 mg daily. 4. Potassium chloride 20 mEq daily. 5. Zocor 40 mg daily. 6. Uloric 40 mg as needed. 7. Travatan eye drops 0.004% both eyes 1 drop at bedtime. 8. Toprol-XL 100 mg daily. 9. Coumadin 5 mg 1-2 tablets daily.  She takes 1 tablet daily except     Monday and Wednesday she takes 2 tablets. 10.Diltiazem CD 240 mg daily.  FAMILY HISTORY:  Noncontributory.  REVIEW OF SYSTEMS:  Other than stated in HPI is negative.  PHYSICAL EXAMINATION:  VITAL SIGNS:  Blood pressure 131/72, heart rate 42-52 beats per minute.  She is afebrile. GENERAL:  This is a well-developed, well-nourished female, in no acute distress. HEENT:  Benign. NECK:  Supple without lymphadenopathy.  Carotid upstrokes +2 bilaterally.  No bruits. LUNGS:  Clear to auscultation throughout. HEART:  Irregularly irregular with no rubs or gallops.  There is a 2/6 systolic murmur at the right upper sternal border that radiates to the apex. ABDOMEN:  Soft. EXTREMITIES:  No edema.  LABORATORY DATA:  Sodium 134, potassium 4.9, chloride 102, CO2 of 22, BUN 48,  creatinine 2.08.  White cell count 9.6, hemoglobin 9.5, hematocrit 29.1.  Cardiac enzymes negative x2.  BNP is elevated at 7845. INR 4.26.  EKG shows atrial fibrillation with slow ventricular response at 46 beats per minute.  ASSESSMENT: 1. Bradycardia secondary to decreased clearance of beta-blocker, in     the setting of acute renal failure.  Beta-blocker was stopped on     admission and calcium channel blocker was stopped this morning. 2. Chronic atrial fibrillation. 3. Systemic anticoagulation with supratherapeutic INR. 4. Acute-on-chronic renal failure secondary to dehydration. 5. Hyperkalemia, resolved. 6. Anemia. 7. Acute-on-chronic diastolic  heart failure.  BNP is elevated     secondary to renal failure.  It is about the same as it was on last     admit, November 01, 2010.  Her lungs were actually clear and she     actually does not appear volume overloaded on exam. 8. Mitral valve disease, status post mitral valve replacement.  PLAN:  Restart Coumadin once INR less than 3.5 per Pharmacy since she has a mitral valve replacement.  Continue to hold beta-blocker and calcium channel blocker. This may take several days to get out of her system secondary to renal failure.  We will check a TSH.     Armanda Magic, M.D.     TT/MEDQ  D:  12/20/2010  T:  12/21/2010  Job:  [id number]  cc:   Lyn Records, M.D. Fax: [phone]  Electronically Signed by Armanda Magic M.D. on 12/24/2010 08:35:45 AM

## 2010-12-25 ENCOUNTER — Ambulatory Visit
Admission: RE | Admit: 2010-12-25 | Discharge: 2010-12-25 | Disposition: A | Discharge status: Home / Self Care | Payer: Medicare Other | Source: Ambulatory Visit | Attending: Radiation Oncology | Admitting: Radiation Oncology

## 2010-12-25 DIAGNOSIS — Z79899 Other long term (current) drug therapy: Secondary | ICD-10-CM | POA: Insufficient documentation

## 2010-12-25 DIAGNOSIS — C50919 Malignant neoplasm of unspecified site of unspecified female breast: Secondary | ICD-10-CM | POA: Insufficient documentation

## 2010-12-25 DIAGNOSIS — H409 Unspecified glaucoma: Secondary | ICD-10-CM | POA: Insufficient documentation

## 2010-12-25 DIAGNOSIS — Z7901 Long term (current) use of anticoagulants: Secondary | ICD-10-CM | POA: Insufficient documentation

## 2010-12-25 DIAGNOSIS — I10 Essential (primary) hypertension: Secondary | ICD-10-CM | POA: Insufficient documentation

## 2010-12-25 DIAGNOSIS — D0590 Unspecified type of carcinoma in situ of unspecified breast: Secondary | ICD-10-CM | POA: Insufficient documentation

## 2010-12-25 DIAGNOSIS — I4891 Unspecified atrial fibrillation: Secondary | ICD-10-CM | POA: Insufficient documentation

## 2010-12-25 DIAGNOSIS — R599 Enlarged lymph nodes, unspecified: Secondary | ICD-10-CM | POA: Insufficient documentation

## 2010-12-25 DIAGNOSIS — Z510 Encounter for antineoplastic radiation therapy: Secondary | ICD-10-CM | POA: Insufficient documentation

## 2011-01-02 ENCOUNTER — Inpatient Hospital Stay (HOSPITAL_COMMUNITY)
Admission: EM | Admit: 2011-01-02 | Discharge: 2011-01-07 | DRG: 293 | Disposition: A | Discharge status: Home Health | Payer: Medicare Other | Attending: Family Medicine | Admitting: Family Medicine

## 2011-01-02 DIAGNOSIS — I498 Other specified cardiac arrhythmias: Secondary | ICD-10-CM | POA: Diagnosis not present

## 2011-01-02 DIAGNOSIS — I079 Rheumatic tricuspid valve disease, unspecified: Secondary | ICD-10-CM | POA: Diagnosis present

## 2011-01-02 DIAGNOSIS — I129 Hypertensive chronic kidney disease with stage 1 through stage 4 chronic kidney disease, or unspecified chronic kidney disease: Secondary | ICD-10-CM | POA: Diagnosis present

## 2011-01-02 DIAGNOSIS — E876 Hypokalemia: Secondary | ICD-10-CM | POA: Diagnosis not present

## 2011-01-02 DIAGNOSIS — R82998 Other abnormal findings in urine: Secondary | ICD-10-CM | POA: Diagnosis present

## 2011-01-02 DIAGNOSIS — Z7901 Long term (current) use of anticoagulants: Secondary | ICD-10-CM

## 2011-01-02 DIAGNOSIS — I4891 Unspecified atrial fibrillation: Secondary | ICD-10-CM | POA: Diagnosis present

## 2011-01-02 DIAGNOSIS — I359 Nonrheumatic aortic valve disorder, unspecified: Secondary | ICD-10-CM | POA: Diagnosis present

## 2011-01-02 DIAGNOSIS — E785 Hyperlipidemia, unspecified: Secondary | ICD-10-CM | POA: Diagnosis present

## 2011-01-02 DIAGNOSIS — I5033 Acute on chronic diastolic (congestive) heart failure: Principal | ICD-10-CM | POA: Diagnosis present

## 2011-01-02 DIAGNOSIS — M109 Gout, unspecified: Secondary | ICD-10-CM | POA: Diagnosis present

## 2011-01-02 DIAGNOSIS — D649 Anemia, unspecified: Secondary | ICD-10-CM | POA: Diagnosis present

## 2011-01-02 DIAGNOSIS — C50919 Malignant neoplasm of unspecified site of unspecified female breast: Secondary | ICD-10-CM | POA: Diagnosis present

## 2011-01-02 DIAGNOSIS — I2789 Other specified pulmonary heart diseases: Secondary | ICD-10-CM | POA: Diagnosis present

## 2011-01-02 DIAGNOSIS — Z79899 Other long term (current) drug therapy: Secondary | ICD-10-CM

## 2011-01-02 DIAGNOSIS — N183 Chronic kidney disease, stage 3 unspecified: Secondary | ICD-10-CM | POA: Diagnosis present

## 2011-01-02 DIAGNOSIS — I509 Heart failure, unspecified: Secondary | ICD-10-CM | POA: Diagnosis present

## 2011-01-02 LAB — POCT I-STAT, CHEM 8
BUN: 40 mg/dL — ABNORMAL HIGH (ref 6–23)
Chloride: 109 mEq/L (ref 96–112)
Creatinine, Ser: 1.9 mg/dL — ABNORMAL HIGH (ref 0.50–1.10)
Sodium: 142 mEq/L (ref 135–145)
TCO2: 22 mmol/L (ref 0–100)

## 2011-01-03 ENCOUNTER — Emergency Department (HOSPITAL_COMMUNITY): Payer: Medicare Other

## 2011-01-03 LAB — CK TOTAL AND CKMB (NOT AT ARMC)
CK, MB: 2.6 ng/mL (ref 0.3–4.0)
Total CK: 69 U/L (ref 7–177)

## 2011-01-03 LAB — COMPREHENSIVE METABOLIC PANEL
AST: 24 U/L (ref 0–37)
Albumin: 3.2 g/dL — ABNORMAL LOW (ref 3.5–5.2)
Alkaline Phosphatase: 130 U/L — ABNORMAL HIGH (ref 39–117)
BUN: 41 mg/dL — ABNORMAL HIGH (ref 6–23)
Chloride: 104 mEq/L (ref 96–112)
Creatinine, Ser: 1.6 mg/dL — ABNORMAL HIGH (ref 0.50–1.10)
Potassium: 3.9 mEq/L (ref 3.5–5.1)
Total Bilirubin: 0.5 mg/dL (ref 0.3–1.2)
Total Protein: 7.4 g/dL (ref 6.0–8.3)

## 2011-01-03 LAB — URINE MICROSCOPIC-ADD ON

## 2011-01-03 LAB — BASIC METABOLIC PANEL
BUN: 40 mg/dL — ABNORMAL HIGH (ref 6–23)
Calcium: 8.8 mg/dL (ref 8.4–10.5)
Creatinine, Ser: 1.66 mg/dL — ABNORMAL HIGH (ref 0.50–1.10)
GFR calc Af Amer: 37 mL/min — ABNORMAL LOW (ref >60)
GFR calc non Af Amer: 30 mL/min — ABNORMAL LOW (ref >60)

## 2011-01-03 LAB — DIFFERENTIAL
Basophils Absolute: 0 10*3/uL (ref 0.0–0.1)
Basophils Relative: 0 % (ref 0–1)
Eosinophils Absolute: 0.2 10*3/uL (ref 0.0–0.7)
Eosinophils Relative: 2 % (ref 0–5)
Monocytes Absolute: 0.8 10*3/uL (ref 0.1–1.0)

## 2011-01-03 LAB — URINALYSIS, ROUTINE W REFLEX MICROSCOPIC
Glucose, UA: NEGATIVE mg/dL
Hgb urine dipstick: NEGATIVE
Protein, ur: 100 mg/dL — AB
Specific Gravity, Urine: 1.018 (ref 1.005–1.030)
pH: 5 (ref 5.0–8.0)

## 2011-01-03 LAB — CBC
HCT: 27.6 % — ABNORMAL LOW (ref 36.0–46.0)
MCHC: 31.9 g/dL (ref 30.0–36.0)
MCHC: 31.9 g/dL (ref 30.0–36.0)
MCV: 87.6 fL (ref 78.0–100.0)
Platelets: 167 10*3/uL (ref 150–400)
Platelets: 158 10*3/uL (ref 150–400)
RDW: 17.5 % — ABNORMAL HIGH (ref 11.5–15.5)
RDW: 17.5 % — ABNORMAL HIGH (ref 11.5–15.5)
WBC: 8.2 10*3/uL (ref 4.0–10.5)
WBC: 7 10*3/uL (ref 4.0–10.5)

## 2011-01-03 LAB — PROTIME-INR: Prothrombin Time: 26.5 seconds — ABNORMAL HIGH (ref 11.6–15.2)

## 2011-01-03 LAB — TROPONIN I: Troponin I: <0.3 ng/mL (ref <0.30)

## 2011-01-03 LAB — PRO B NATRIURETIC PEPTIDE: Pro B Natriuretic peptide (BNP): 8316 pg/mL — ABNORMAL HIGH (ref 0–125)

## 2011-01-04 DIAGNOSIS — I5033 Acute on chronic diastolic (congestive) heart failure: Secondary | ICD-10-CM

## 2011-01-04 LAB — BASIC METABOLIC PANEL
CO2: 26 mEq/L (ref 19–32)
Calcium: 9 mg/dL (ref 8.4–10.5)
Creatinine, Ser: 1.37 mg/dL — ABNORMAL HIGH (ref 0.50–1.10)
GFR calc non Af Amer: 38 mL/min — ABNORMAL LOW (ref >60)

## 2011-01-04 LAB — URINE CULTURE
Colony Count: NO GROWTH
Culture  Setup Time: 201209070709
Culture: NO GROWTH

## 2011-01-04 LAB — CBC
MCV: 87.8 fL (ref 78.0–100.0)
Platelets: 169 10*3/uL (ref 150–400)
RBC: 3.27 MIL/uL — ABNORMAL LOW (ref 3.87–5.11)
RDW: 17.4 % — ABNORMAL HIGH (ref 11.5–15.5)
WBC: 7.5 10*3/uL (ref 4.0–10.5)

## 2011-01-04 LAB — TSH: TSH: 1.828 u[IU]/mL (ref 0.350–4.500)

## 2011-01-04 LAB — PROTIME-INR
INR: 2.11 — ABNORMAL HIGH (ref 0.00–1.49)
Prothrombin Time: 24 seconds — ABNORMAL HIGH (ref 11.6–15.2)

## 2011-01-04 LAB — DIFFERENTIAL
Basophils Absolute: 0 10*3/uL (ref 0.0–0.1)
Basophils Relative: 0 % (ref 0–1)
Eosinophils Absolute: 0.2 10*3/uL (ref 0.0–0.7)
Eosinophils Relative: 3 % (ref 0–5)
Neutrophils Relative %: 74 % (ref 43–77)

## 2011-01-04 LAB — MAGNESIUM: Magnesium: 1.9 mg/dL (ref 1.5–2.5)

## 2011-01-05 LAB — CBC
MCH: 27.3 pg (ref 26.0–34.0)
MCHC: 31.5 g/dL (ref 30.0–36.0)
MCV: 86.6 fL (ref 78.0–100.0)
Platelets: 170 10*3/uL (ref 150–400)
RDW: 17.2 % — ABNORMAL HIGH (ref 11.5–15.5)

## 2011-01-05 LAB — BASIC METABOLIC PANEL
BUN: 39 mg/dL — ABNORMAL HIGH (ref 6–23)
Calcium: 9 mg/dL (ref 8.4–10.5)
Creatinine, Ser: 1.67 mg/dL — ABNORMAL HIGH (ref 0.50–1.10)
GFR calc Af Amer: 36 mL/min — ABNORMAL LOW (ref >60)
GFR calc non Af Amer: 30 mL/min — ABNORMAL LOW (ref >60)
Glucose, Bld: 81 mg/dL (ref 70–99)
Potassium: 3.4 mEq/L — ABNORMAL LOW (ref 3.5–5.1)

## 2011-01-05 LAB — PROTIME-INR: Prothrombin Time: 26.3 seconds — ABNORMAL HIGH (ref 11.6–15.2)

## 2011-01-05 NOTE — Consult Note (Signed)
NAMECACI, ORREN NO.:  [id number]  MEDICAL RECORD NO.:  [id number]  LOCATION:  1408                         FACILITY:  [facility]  PHYSICIAN:  Lyn Records, M.D.   DATE OF BIRTH:  Aug 08, 1933  DATE OF CONSULTATION: DATE OF DISCHARGE:                                CONSULTATION   REASON FOR CONSULTATION:  Uncertain.  CONCLUSIONS: 1. Chronic diastolic heart failure with mild acute component, improved     after several doses of IV Lasix.     a.     According to the patient, there has been a significant      weight gain since her discharge on December 21, 2010, but there are      no weights recorded in discharge summaries to be able to make a      comparison. 2. Chronic atrial fibrillation.     a.     Excessive rate control with pauses up to 2-3 seconds      secondary to combination therapy of diltiazem, metoprolol, and      clonidine. 3. Severe secondary pulmonary hypertension. 4. Mitral valve disease status post mitral valve repair 2001. 5. Severe tricuspid regurgitation. 6. Chronic kidney disease.     a.     Recent acute on chronic kidney failure episodes with      hyperkalemia on October 24, 2010, and December 18, 2010.     b.     Acute kidney failure with creatinine greater than 3 and      potassium greater than 7 on October 24, 2010. 7. Breast cancer, currently undergoing treatment. 8. Chronic anemia. 9. Very poor prognosis due to inability to control the patient's     severe diastolic heart failure, her medication regimen, and her     nutritional intake.  RECOMMENDATIONS: 1. The patient was recently discharged from the hospital on 20 mg of     Lasix per day.  This is an entirely meager dose of diuretic therapy     for a lady with the severity of heart failure that she has.  I will     recommend that we switch her to torsemide which may have better GI     absorption and may improve fluid homeostasis. 2. I would recommend discontinuing the combination of  metoprolol,     diltiazem, and clonidine as this will ultimately result in severe     bradycardia.  I would remove clonidine from this medical regimen     and we may need to decrease the dose of metoprolol, but we will     follow. 3. I would avoid ACE and ARB therapy as these have contributed to     hyperkalemia and renal failure in the past. 4. I feel there are too many physicians involved in her care over the     past 3 months with too many medication changes and no consistent     followup that is a system problem as well as an individual problem     with this patient as she lives alone and has difficulty getting to     doctors' appointments.  This makes her likely outcome ultimately  very poor. 5. We need to consider placement if we are going to control heart     failure because this will be the only way to be certain that her     medications are taken regularly and that her diet is consistent.  COMMENTS:  The patient is 37 and has severe medical problems/comorbidities including history of mitral valve disease with prior mitral valve repair, severe diastolic heart failure, severe pulmonary hypertension secondary to diastolic heart failure and mitral valve disease, moderate-to-severe tricuspid regurgitation contributing to lower extremity swelling, chronic atrial fibrillation that at times can have very rapid ventricular response.  Currently, her ventricular response is relatively slow.  The patient also has significant kidney impairment that can acutely decompensate depending upon her dietary intake which varies widely/wildly.  She has not had chest pain.  She began noticing over the past 3 days that there had been significant weight gain since discharge from the hospital on December 21, 2010.  She could only guess about what her weights have been.  No weights have been documented in the discharge summaries.  We are really in the dark on exactly what her discharge weight was on  8/25.  She became concerned, however, that her thighs were swelling, she was not making urine, and she felt that there was progression of shortness of breath.  She called several places including, according to her my office.  Some of these calls were mediated by the visiting home nurse.  Ultimately, someone told her to come to the emergency room last night, and from there she was admitted to the hospital.  Please see the medication reconciliation form in the admitting history and physical for her medications on admission.  Of note is a very small dose of diuretic therapy that she has been discharged from the hospital on.  As an outpatient, she has been on as much as 160 mg of Lasix daily to prevent excessive fluid accumulation.  PHYSICAL EXAMINATION:  GENERAL:  The patient is lying flat.  She is not short of breath. NECK:  Veins are significantly distended as they always are due to severe pulmonary hypertension. LUNGS:  Clear. CARDIAC EXAM:  Reveals a 3/6 systolic murmur of tricuspid regurgitation. There may also be a faint murmur of mitral regurgitation in the left axilla. ABDOMEN:  There is no ascites.  The liver is not pulsatile. EXTREMITIES:  There is tight lower extremity skin with chronic stasis changes, although the legs are not as taut as they have been at times in the past.  SIGNIFICANT LABORATORY DATA:  Include a hemoglobin of 8.8, WBC of 7.8, platelet count of 150,000.  INR of 2.4.  Potassium 3.6, BUN 40, creatinine 1.7, SGOT 24, alk phos 130.  BNP 8300.  Chest x-ray does not reveal pulmonary edema.  DISCUSSION:  Severe diastolic heart failure with frequent medication changes, aggravated by very sensitive renal function with reference to volume status and multiple hospital admissions for hyperkalemia.  The patient is also relatively bradycardic due to her medication regimen. Please see recommendations above.     Lyn Records, M.D.     HWS/MEDQ  D:   01/03/2011  T:  01/04/2011  Job:  [id number]  cc:   Henri Medal, MD Fax: [phone]  [facility]  Electronically Signed by Verdis Prime M.D. on 01/05/2011 08:12:38 PM

## 2011-01-06 ENCOUNTER — Other Ambulatory Visit (HOSPITAL_COMMUNITY): Payer: Medicare Other

## 2011-01-06 LAB — BASIC METABOLIC PANEL
BUN: 37 mg/dL — ABNORMAL HIGH (ref 6–23)
Creatinine, Ser: 1.41 mg/dL — ABNORMAL HIGH (ref 0.50–1.10)
GFR calc non Af Amer: 36 mL/min — ABNORMAL LOW (ref >60)
Glucose, Bld: 82 mg/dL (ref 70–99)
Potassium: 3.6 mEq/L (ref 3.5–5.1)

## 2011-01-06 LAB — CBC
MCH: 27.1 pg (ref 26.0–34.0)
MCHC: 31.1 g/dL (ref 30.0–36.0)
MCV: 87.2 fL (ref 78.0–100.0)
Platelets: 191 10*3/uL (ref 150–400)
RBC: 3.43 MIL/uL — ABNORMAL LOW (ref 3.87–5.11)

## 2011-01-06 NOTE — Discharge Summary (Signed)
NAMEMERELYN, KLUMP NO.:  [id number]  MEDICAL RECORD NO.:  [id number]  LOCATION:  1430                         FACILITY:  [facility]  PHYSICIAN:  Richarda Overlie, MD       DATE OF BIRTH:  06/11/1933  DATE OF ADMISSION:  12/18/2010 DATE OF DISCHARGE:  12/21/2010                              DISCHARGE SUMMARY   PRIMARY CARE PHYSICIAN:  [facility].  PRIMARY CARDIOLOGIST:  Dr. Verdis Prime  DISCHARGE DIAGNOSES: 1. Bradycardia in the setting of decreased clearance of metoprolol, in     the setting of acute renal failure. 2. Acute on chronic renal failure. 3. Acute on chronic diastolic heart failure. 4. Chronic atrial fibrillation. 5. Systemic anticoagulation with supratherapeutic INR. 6. Hyperkalemia. 7. Anemia. 8. Mitral valve disease status post mitral valve prolapse. 9. Abnormal LFTs with negative workup thus far. 10.Dyslipidemia. 11.Gouty arthropathy. 12.Chronic venous insufficiency.  DISCHARGE MEDICATIONS: 1. Zofran 4 mg p.o. q. 6 p.r.n. 2. Coumadin per pharmacy protocol. 3. Cardizem 180 mg 1 capsule p.o. daily. 4. Warfarin 5 mg every other day. 5. Clonidine 0.1 mg p.o. twice daily. 6. Lasix 20 mg p.o. daily. 7. Os-Cal 500. 8. Vitamin D 1 tablet p.o. twice daily. 9. Travatan 1 drop in both eyes daily. 10.Uloric 1 tablet p.o. daily.  PROCEDURES:  Chest x-ray shows cardiomegaly, pulmonary vascular congestion.  CT scan of the abdomen and pelvis shows serpiginous fluid collection within the right anterior subcutaneous region below the level of the umbilicus.  A 4.5 cm cyst in the left lobe of the liver related to  process impressing upon the liver cannot be completely excluded.  A  2 cm right renal cyst without evidence of hydronephrosis, 1.4 cm gallstone.  HIDA scan shows normal study with normal gallbladder ejection fraction.  SUBJECTIVE:  This is a 77 year old female who presents to the ER with a chief complaint of nausea, inability  to urinate, decreased urine output, and right upper quadrant pain and epigastric pain.  The patient denied any diarrhea or constipation.  Prior to admission, she was found to have a potassium of 7 in the ER and a creatinine of 3.39.  Two months ago, creatinine was 1.14.  HOSPITAL COURSE: 1. Hyperkalemia.  The patient was treated with calcium gluconate, D50,     insulin, and sodium bicarbonate in the ER.  She was also initiated     on Lasix.  With resolution of her hyperkalemia, potassium     supplementation has been discontinued. 2. Acute on chronic kidney injury.  This was thought to be secondary     to hemodynamic changes and prerenal etiology.  The patient has had     good urine output of almost of over 2 L on a daily basis and her     creatinine is down to 1.33.  Her Lasix was held during this     hospitalization, but this is being resumed because of her acute on     chronic diastolic heart failure. 3. Supratherapeutic INR.  The patient was found to have an INR of 5.12     upon admission.  Today her INR is 3.17.  Coumadin will be resumed  and home health RN will be set up for home INR monitoring. 4. Acute on chronic diastolic heart failure with elevated BNP in the     setting of renal insufficiency.  The patient did not have any     evidence of cardiopulmonary compromise and was not hypoxic.  Her     Lasix is being resumed. 5. Chronic atrial fibrillation.  The patient was found to have pauses     on telemetry up to 2.34 seconds long because of decreased clearance     of the beta blocker.  Her diltiazem and her metoprolol was     discontinued.  Cardiology consultation by Dr. Armanda Magic was     obtained and the patient was evaluated by Cardiology.  She did not     have any more pauses for at least 36 hours prior to discharge and     her diltiazem is being resumed.  DISPOSITION:  The patient was evaluated with physical and occupational therapy, and was deemed to be appropriate  for discharge.  PHYSICAL EXAMINATION:  Prior to discharge, her physical examination was; VITAL SIGNS:  Blood pressure 164/91, pulse of 94, temperature 98.3, 97% on room air. GENERAL:  Comfortable, in no acute cardiopulmonary distress. HEENT:  Pupils equal and reactive to light.  Extraocular movements are intact. NECK:  Supple.  No JVD. LUNGS:  Clear to auscultation bilaterally.  No wheezes, crackles, or rhonchi. CARDIOVASCULAR:  Regular rate and rhythm.  No murmurs, rubs, or gallops. ABDOMEN:  Obese, soft, nontender, nondistended. EXTREMITIES:  Without cyanosis, clubbing, or edema.  FOLLOWUP: 1. Followup appointment with Dr. Verdis Prime in 5-7 days for     evaluation of her atrial fibrillation and bradycardia. 2. Follow up with PCP in 5-7 days.     Richarda Overlie, MD     NA/MEDQ  D:  12/21/2010  T:  12/21/2010  Job:  [id number]  cc:   Lyn Records, M.D. Fax: [phone]  [facility]  Electronically Signed by Richarda Overlie MD on 01/06/2011 07:24:48 AM

## 2011-01-07 LAB — BASIC METABOLIC PANEL
BUN: 32 mg/dL — ABNORMAL HIGH (ref 6–23)
CO2: 31 mEq/L (ref 19–32)
Calcium: 9.4 mg/dL (ref 8.4–10.5)
Glucose, Bld: 76 mg/dL (ref 70–99)
Potassium: 3.3 mEq/L — ABNORMAL LOW (ref 3.5–5.1)
Sodium: 137 mEq/L (ref 135–145)

## 2011-01-16 NOTE — Discharge Summary (Signed)
NAMENAI, BORROMEO NO.:  [id number]  MEDICAL RECORD NO.:  [id number]  LOCATION:  1408                         FACILITY:  [facility]  PHYSICIAN:  Gwenyth Bender, NP      DATE OF BIRTH:  03-09-1934  DATE OF ADMISSION:  01/02/2011 DATE OF DISCHARGE:  01/07/2011                        DISCHARGE SUMMARY - REFERRING   PRIMARY CARE PROVIDER:  [facility].  PRIMARY CARDIOLOGIST:  Collier Salina. Katrinka Blazing, MD  ONCOLOGIST:  Unk Lightning, MD  DISCHARGE DIAGNOSES: 1. Acute-on-chronic diastolic heart failure. 2. Atrial fibrillation. 3. Acute-on-chronic kidney disease stage 3. 4. Hypokalemia. 5. Hypertension. 6. Pyuria. 7. History of breast cancer, starting radiation on January 08, 2011. 8. Mitral valve repair in 2001. 9. Severe tricuspid regurgitation. 10.Chronic anemia.  DISCHARGE MEDICATIONS: 1. Potassium chloride 10 mEq p.o. daily. 2. Torsemide 40 mg p.o. daily. 3. Coumadin 2.5 mg p.o. daily. 4. Diltiazem 240 mg p.o. daily. 5. Toprol XL 100 mg p.o. daily. 6. Os-Cal 600 mg/200 international units 1 tablet p.o. b.i.d. 7. Zofran 4 mg p.o. every 6 hours as needed for nausea. 8. Travoprost ophthalmic 0.004% 1 drop in both eyes daily at bedtime. 9. Febuxostat 40 mg p.o. daily.  MEDICATIONS STOPPED THIS HOSPITALIZATION: 1. Lasix 20 mg p.o. daily. 2. Coumadin 5 mg 2 tablets p.o. daily. 3. Clonidine 0.1 mg p.o. b.i.d.  DIAGNOSTIC LABS:  Sodium 142, potassium 4.2, chloride 109, CO2 22, BUN 40, creatinine 1.9.  WBCs 8.2, hemoglobin 9.4, hematocrit 29.5, platelets 167.  Urinalysis:  Small leukocytes, 100 protein, cloudy in appearance, hyaline casts, 3-6 WBCs, many bacteria, few squamous epithelial cells.  Total CK 69, CK-MB 2.6, troponin I less than 0.30. Pro BNP was 8316.  PT 26.5, INR 2.39, PTT 44.  Magnesium 1.9.  TSH is 1.82.  Urine culture shows no growth to date.  DIAGNOSTIC IMAGING:  Chest x-ray on September 7 yields cardiomegaly without  pulmonary edema.  Right pleural effusion.  CONSULTATIONS:  Cardiology, Verdis Prime, MD  PROCEDURES:  None.  BRIEF HISTORY:  [name] is a 77 year old female with a history of diastolic heart failure; mitral valve prolapse, status post repair of 2001; A-fib, on Coumadin; right breast cancer, status post partial right mastectomy; and chronic kidney disease stage 3, who presented to the [facility] ED with a chief complaint of worsening dyspnea on exertion, subjective fluid buildup in her thighs, orthopnea, and a 15 to 18-pound weight gain over the previous 10 days consistent with an acute-on- chronic diastolic heart failure exacerbation.  She was admitted by the [facility] for further evaluation and treatment.  HOSPITAL COURSE: 1. Acute-on-chronic diastolic heart failure:  The patient was admitted     to the telemetry unit.  She was diuresed with IV Lasix 20 mgq.12.     The patient's chart indicates 2-D echo done on October 25, 2010,     yields systolic function normal and an estimated ejection fraction     in the range of 60% to 65%.  The ventricular septum showed septal     motion paradox.  Contour showed diastolic flattening.  Dr. Katrinka Blazing     was consulted and saw the patient on September 7.  He recommended  discontinuing the clonidine that she was on at home as well as the     Lasix and he started the patient on Demadex.  The patient continued     to diurese.  Dr. Michaelle Copas consultation also recommended avoiding ACE     as these had contributed to hyperkalemia and renal failure in this     patient in the past.  Of note, on admission the patient's weight     was 78.6 kg and on the day of discharge, her weight is 73.2 kg.     The patient has follow up appointment with Dr. Katrinka Blazing on September     17. 2. Atrial fibrillation:  Initially the patient had excessive rate     control with pauses of 2 to 3 seconds probably secondary to her     medication combination of diltiazem,  metoprolol, and clonidine.     Clonidine was discontinued.  The patient is going home on Toprol XL     100 mg daily.  For the remainder of her hospitalization, telemetry     yielded A-fib with rate control.  The patient is also on Coumadin.     INR on the date of discharge is 3.68.  The patient is being     discharged on 2.5 mg of Coumadin.  She has an appointment on     September 12 to go to the lab for an INR draw at Dr. Michaelle Copas     office.  At that time he will monitor and manage Coumadin. 3. Chronic kidney disease stage 3:  On admission, her creatinine was     1.9.  Chart review indicates that her baseline creatinine is 1.3 to     1.4.  On the day of discharge, her creatinine is 1.39. 4. Hypokalemia:  On the day of discharge, the patient's potassium was     3.3.  The patient was given 40 mEq of potassium chloride and     started on a daily dose of 10 mEq p.o. 5. Hypertension:  The patient's blood pressure had fair control while     in the hospital.  Medications as dictated above.  On admission, she     was on clonidine and Lasix, both of which have been discontinued. 6. Pyuria:  The patient was asymptomatic.  She ran no fever, had no     white count so no antibiotics were given this hospitalization. 7. History of breast cancer:  The patient is starting radiation on     January 08, 2012.  She will be discharged late this afternoon as     she [facility] to [facility] her     radiation treatments. 8. Mitral valve disease with repair in 2001:  The patient is on     Coumadin. 9. Severe tricuspid regurgitation. 10.Chronic anemia:  The patient's hemoglobin during hospitalization     ranged between 8.8 and 9.9.  There were no signs or symptoms of     active bleeding.  PHYSICAL EXAM:  VITAL SIGNS:  Temperature 97.9, blood pressure 162/62, heart rate 61, respirations 20, saturations 96% on room air.  Weight 73.2 kg, down from 78.6 on admission. GENERAL:   Sitting on side of bed, alert, smiling, in no acute distress. CARDIOVASCULAR:  Irregularly irregular rhythm.  Trace lower extremity edema.  Pedal pulses present and palpable. RESPIRATORY:  Normal effort.  Breath sounds clear to auscultation bilaterally.  No rales.  No wheezes. ABDOMEN:  Obese,  soft.  Positive bowel sounds throughout.  Abdomen is nontender to palpation. NEUROLOGIC:  Alert and oriented x3.  Speech clear.  Facial symmetry.  ACTIVITY:  As tolerated.  DIET:  Heart-healthy, low sodium.  FOLLOWUP: 1. The patient has an appointment with Dr. Verdis Prime, September 17     at 9 a.m.  He will follow up with medications. 2. The patient has an appointment on September 12 to go to the lab at     Dr. Michaelle Copas office to have blood draw for INR and Coumadin dosing. 3. The patient will have home health RN, specifically to evaluate     current home strategies for the patient to manage medications.     There is a concern that the patient gets confused regarding her     medications.  Home health nurse to also assist with establishing     daily record for the patient to weigh daily and develop strategy     for the patient to implement if she starts to gain weight, thus the     goal being to avoid hospitalization.  DISPOSITION:  The patient is medically stable and ready for discharge to home.  Time spent on this discharge is 55 minutes.  Dictated For:  Ramiro Harvest, MD     Gwenyth Bender, NP     KMB/MEDQ  D:  01/07/2011  T:  01/07/2011  Job:  [id number]  cc:   Lowella Dell, M.D. Fax: [phone]  Lyn Records, M.D. Fax: [phone]  [facility] Fax: [phone]  Electronically Signed by Toya Smothers  on 01/16/2011 07:55:23 AM

## 2011-01-29 NOTE — H&P (Signed)
[name], [name] NO.:  [id number]  MEDICAL RECORD NO.:  [id number]  LOCATION:  [facility]                         FACILITY:  [facility]  PHYSICIAN:  Carlota Raspberry, MD         DATE OF BIRTH:  [birth year]  DATE OF ADMISSION:  01/02/2011 DATE OF DISCHARGE:                             HISTORY & PHYSICAL   PRIMARY CARE PHYSICIAN:  Production assistant, radio.  PRIMARY CARDIOLOGIST:  Lyn Records, MD  CHIEF COMPLAINT:  Weight gain and dyspnea on exertion.  HISTORY OF PRESENT ILLNESS:  This is a 77 year old woman with a history of chronic diastolic heart failure, moderate-to-severe aortic stenosis, mild-to-moderate mitral regurgitation status post mitral valve repair in 2001, severe tricuspid regurgitation, chronic atrial fibrillation on Coumadin, chronic kidney disease, and right breast cancer status post partial right mastectomy, who had a recent admission in August for hyperkalemia due to acute on chronic kidney disease and also during that admission found to have pauses on telemetry thought due to nodal agents in the setting of acute renal failure who was at home in her usual state of health until a few days ago when she started getting increased dyspnea on exertion, fluid in her thighs and lower extremities, orthopnea, and reported weight gain from 150 up to 174, which she believes is about 15-18 pounds over the last 10 days.  On her last discharge, she was 75.7 kg and her reported self weight was 174 pounds, which is about 79 kg.  She came to the emergency room where her vital signs have been fairly stable including normal oxygen saturations, normal hemodynamics, and her lab work has been fairly unrevealing including a BMP of 8316, which is not really that elevated compared to her baseline.  She also had a chest x-ray here showing cardiomegaly but no pulmonary edema, but with a right pleural effusion.  REVIEW OF SYSTEMS:  As above, otherwise she specifically  denies any chest pain or angina.  She denies fevers, chills, night sweats, dizziness, loss of consciousness, nausea, vomiting, abdominal pain, cough, dysuria, bowel movement changes, rash, and paroxysmal nocturnal dyspnea.  PAST MEDICAL HISTORY: 1. Diastolic heart failure with an EF of 60%, moderate-to-severe     aortic stenosis, severe tricuspid regurgitation, and pulmonary     pressures of 53 on her last echo in June 2012. 2. Atrial fibrillation, on Coumadin. 3. History of mitral valve prolapse, status post repair in 2001. 4. Chronic kidney disease stage 3. 5. History of hyperkalemia. 6. Gouty arthropathy. 7. Chronic anemia. 8. Hypertension. 9. Recent diagnosis of right breast cancer, status post partial right     mastectomy, followed by Dr. Darnelle Catalan. 10.Hyperlipidemia.  MEDICATIONS:  Home medications were reconciled with the patient.  She has a bag full of her medicine bottles and includes: 1. Coumadin 10 mg daily. 2. Cardizem 240 mg daily. 3. Lasix 20 mg daily. 4. Zofran p.r.n. 5. Clonidine 0.1 mg p.o. b.i.d. 6. Os-Cal, calcium and vitamin D 600 mg b.i.d. 7. Travatan eye drops. 8. Uloric 40 mg daily. 9. Metoprolol succinate 100 mg b.i.d.  ALLERGIES:  She has no known drug allergies.  SOCIAL HISTORY:  She is a fairly well-functioning elderly lady who lives  at home with her son and has lots of neighbors and friends close by. She is able to ambulate with a walker and a cane and remains independent.  She is a never smoker and does no drugs.  No alcohol.  She does try to watch her sodium intake.  FAMILY HISTORY:  Her mother is still alive at 75 years old, but does have heart troubles.  Her father deceased of leukemia.  PHYSICAL EXAM:  VITAL SIGNS:  Temperature 97.9, blood pressure 145/72, pulse 51, respirations 20, and 96-99% on room air. GENERAL:  She is a very young-appearing 77 lying in a hospital stretcher with her friend at the bedside.  She is really quite nice and  in no distress and pretty with it and able to give a good history. HEENT:  Her pupils are equal, round, reactive to light and accommodation.  Her extraocular muscles intact.  Her sclerae are clear and anicteric.  Her mouth is fairly moist and normal appearing.  No oral pharyngeal lesions. NECK:  Supple.  On her right neck, she has a very prominent external jugular vein and jugular venous pulsations are noted about 8 cm or so right at the angle of her mandible, but it is somewhat difficult to evaluate.  There is no gross hepatojugular reflux. LUNGS:  Have bilateral basilar crackles just at the bases, they do not go up to midlung field.  There are no gross rhonchi or wheezes. HEART:  Irregular and not tachycardic.  She has a holosystolic whooshing murmur along the right and left sternal borders that decreases in intensity towards the apex.  There are no S3 or S4 appreciated.  There is no radiation into her carotids. ABDOMEN:  Soft, nontender, nondistended, benign, and unremarkable. EXTREMITIES:  Warm, well perfused without cyanosis or clubbing.  Her bilateral radials are easily palpable.  Her bilateral lower extremities exhibit gross chronic venous stasis changes and hyperpigmentation with evidence of prior ulcerations that are healed over now.  Her lower extremities do pit going up to about her knees, but it is more indurated than it is soft pitting.  Her bilateral DP pulses are easily palpable. NEURO EXAM:  Grossly intact with no focal neurological deficit.  She is alert, conversant, oriented, and moving her extremities spontaneously. SKIN:  Warm without diaphoresis.  LAB WORK:  Her white blood cell count is 8.2, her hematocrit is low at 29.5, but within baseline, platelets are 167.  Her INR is 2.39.  Her sodium is 114 and potassium is 3.9.  BUN and creatinine are 41 and 1.6, which appear to be within her baseline that bounces around.  Her LFTs are unremarkable except an alk phos that  is minimally up at 130.  Her albumin is 3.2.  Her calcium is 9.5.  Her troponins are negative x1. BNP is 8316, this is a bit above her last recorded value, but she does have a BMP of 8900 back in June of this year as well.  Her UA shows 100 protein, small leukocytes, but negative nitrites.  There are 3-6 white blood cells and many bacteria.  She has a urine culture pending.  Her chest x-ray shows no consolidations, no pulmonary edema and a probable small right pleural effusion and cardiomegaly.  Her EKG is in fibrillation with no apparent T-waves, it has normal axis. Her QRS is not prolonged at 70 milliseconds.  She has decent R-wave progression.  There are no ischemic changes.  Her QTC is about 439. Overall is a fairly  unremarkable EKG other than the atrial fibrillation, which is known for her, it appears consistent with prior.  IMPRESSION:  This is a 77 year old lady with a history of diastolic heart failure, mitral valve prolapse status post repair in 2001, atrial fibrillation on Coumadin, right breast cancer status post partial right mastectomy, and chronic kidney disease, who presents with worsening dyspnea on exertion, subjective fluid buildup in her thighs, orthopnea, and a 15-18 pound weight gain over the past 10 days consistent with an acute on chronic diastolic CHF exacerbation. 1. Acute on chronic DCHF.  Her BMP is not really that much above its     baseline.  She does not have any frank pulmonary edema on chest x-     ray.  Reviewing her echo from June 2012 shows severe TR and     moderate-to-severe AS with an elevated pulmonary pressure as well.     Therefore, there may be some valvular disease contributing to her     suspected diastolic CHF.  Regardless, we will diurese her with 20     mg of IV Lasix in the morning when she wakes up so as to not have     her urinating through the night.  If she does not put out at least     100 cc within the first hour, would repeat 20 mg  IV bolus and     continue to diurese her with 20-40 mg IV bolus q.6 to q.12.  Would     also continue to monitor her electrolytes, which are stable and     fine at this point.  Regarding her medication regimen given that     she does not have systolic heart failure, there are no real     mortality proven medications at this point.  Therefore, we will     continue her home medication of Toprol, Cardizem, and clonidine.  I     am not sure why she is not on an ACE inhibitor, but there is less     data for this and diastolic failure, therefore we will hold off on     initiating this any ways.  The patient is also requesting a     nutrition consult to help her with a low sodium diet. 2. Pyuria and bacteriuria.  The patient is denying any dysuria and     does not have an elevated white blood cell count and denies any     fevers, therefore this is asymptomatic bacteriuria and we will not     treat this with antibiotics at this point. 3. Atrial fibrillation.  She is not with RVR.  We will continue her     home Coumadin. 4. Anemia.  Her hemoglobin is at baseline. 5. Chronic kidney disease.  Her renal function also appears to be     within baseline.  We will just keep an eye on this as we diurese     her with IV Lasix. 6. Fluid, electrolytes, and nutrition.  We will not give her any IV     fluids, but rather diurese her.  She should get a 2-gram sodium     diet.  We will continue to monitor her electrolytes as we diurese     her. 7. IV access.  She has 1 peripheral IV in place. 8. Code status.  She is a full code as discussed with the patient. 9. Prophylaxis.  She is systemically anticoagulated.  She is not  having any pain.  The patient will be admitted to [facility].         ______________________________ Carlota Raspberry, MD    EB/MEDQ  D:  01/03/2011  T:  01/03/2011  Job:  [id number]  Electronically Signed by Carlota Raspberry MD on 01/29/2011 12:10:57 PM

## 2011-02-05 NOTE — H&P (Signed)
NAME:  [name], [name] NO.:  [id number]  MEDICAL RECORD NO.:  [id number]  LOCATION:  [facility]                         FACILITY:  [facility]  PHYSICIAN:  Baltazar Najjar, MD     DATE OF BIRTH:  01-May-1933  DATE OF ADMISSION:  12/18/2010 DATE OF DISCHARGE:                             HISTORY & PHYSICAL   PRIMARY CARE PHYSICIAN:  [facility].  CARDIOLOGIST:  Dr. Verdis Prime.  ONCOLOGIST:  Dr. Darnelle Catalan.  CODE STATUS:  The patient is a full code.  CHIEF COMPLAINT:  Nausea and inability to urinate.  HISTORY OF PRESENT ILLNESS:  [name] is a 77 year old African American woman with history of CKD stage 3, previous episodes of acute on chronic kidney injury and hyperkalemia.  The patient presented to the ER today with inability to urinate since this morning and decreasing urine output for the last couple of days associated with nausea and her mouth taste like medicine.  She denies any burning micturition or dysuria.  Denies any flank pain, any fever or chills. She is complaining of nausea and epigastric discomfort without any pain. No change in her bowel habits.  No diarrhea or constipation.  She had a bowel movement today, which she described as regular.  In the ED, initial workup showed evidence of hyperkalemia with a potassium of 7 and acute on chronic kidney injury with a creatinine of 3.39.  Baseline creatinine in July was 1.14.  The patient admitted that she had been taking Lasix and potassium as prescribed and she was also eating banana with her cereal daily.  She drinks orange juice and she eats a lot of tomatoes.  PAST MEDICAL HISTORY: 1. Diastolic congestive heart failure. 2. Atrial fibrillation. 3. History of CKD, stage 3. 4. History of hyperkalemia. 5. Gouty arthropathy. 6. Chronic anemia. 7. Hypertension. 8. History of mitral valve disease, status post mitral valve repair in     2001. 9. Cholelithiasis. 10.Recent diagnosis  with right breast carcinoma, status post partial     right mastectomy, followed by Dr. Darnelle Catalan. 11.Hyperlipidemia. 12.Chronic venous insufficiency.  PAST SURGICAL HISTORY: 1. Mitral valve replacement. 2. History of appendectomy. 3. Multiple surgeries in her right foot/great toe. 4. History of C-section.  SOCIAL HISTORY:  Lives with her family.  Denies smoking, EtOH drinking, or illicit drug use.  ALLERGIES:  No known drug allergies.  HOME MEDICATIONS: 1. Clonidine 0.1 mg p.o. twice daily. 2. Calcium carbonate with vitamin D 600 mg over 200 international     units 1 tablet p.o. twice daily. 3. Lasix 20 mg 1 tablet daily. 4. Potassium chloride 20 mEq 1 tablet p.o. daily. 5. Zocor 40 mg q.p.m. 6. Uloric 40 mg daily as needed.  As per note, it was prescribed     daily, but the patient takes only when she feels like she needs it. 7. Travatan ophthalmic 0.004%, both eyes 1 drop daily at bedtime. 8. Metoprolol XL 100 mg 1 tablet daily. 9. Coumadin 5 mg 1 to 2 tablets daily.  She takes 1 tablet daily     except Mondays and Wednesday when she takes 2 tablets of Coumadin. 10.Diltiazem CD/XT 240 mg 1 capsule p.o. daily.  REVIEW OF  SYSTEMS:  As above in HPI.  Other systems reported negative. The patient denies any fever or chills.  No chest pain.  She does have shortness of breath with exertion only.  Denies any vomiting.  She admitted to have nausea.  No abdominal pain.  However, she described it as abdominal discomfort in her epigastric area and no dysuria; however, she had decreasing urine output and no flank pain.  No hematuria.  PHYSICAL EXAMINATION:  VITAL SIGNS:  Blood pressure 172/73, heart rate 83, temperature 97.3, and O2 saturation 100% on oxygen. GENERAL:  She is alert and oriented x3, not in acute distress. NECK:  Supple.  No JVD. LUNGS:  Clear to auscultation bilaterally.  No rales or wheezing appreciated. CARDIOVASCULAR:  S1 and S2, irregularly irregular. ABDOMEN:   Soft.  She does have some mild discomfort in the epigastric right upper quadrant area on deep palpation, which she described as discomfort with no pain.  Bowel sounds heard normally. EXTREMITIES:  Showed no pedal edema. NEUROLOGICAL:  Nonfocal.  LABORATORY DATA:  Sodium of 137, potassium 6.9, nonhemolyzed, CO2 of 18, chloride 105, glucose 87, BUN 54, creatinine 3.39, estimated GFR 16, and calcium 9.3.  CBC showed WBCs of 11.3, hemoglobin 10.4, hematocrit 33.3, and platelet count 170.  Urinalysis showed trace ketones, 100 mg/dL of protein, no blood, trace leukocytes, few bacteria.  RADIOLOGY/IMAGING STUDIES:  There is no x-ray available.  ASSESSMENT: 1. Profound hyperkalemia most likely secondary to potassium     supplementation and high potassium food. 2. Acute on chronic kidney injury. 3. Diastolic congestive heart failure, currently compensated. 4. History of atrial fibrillation, on Coumadin. 5. History of mitral valve replacement. 6. Recently diagnosed right breast cancer, status post partial     mastectomy. 7. Hypertension.  PLAN: 1. With hyperkalemia with no acute EKG changes, the patient received a     cocktail of calcium gluconate, D50, insulin, and sodium bicarbonate     in the ER.  She was also given a dose of Lasix as well.  We will     repeat potassium level within 1 hour. 2.  I will also consider giving a dose of Kayexalate if     her potassium is still elevated.  Reason for her hyperkalemia is     probably secondary to potassium supplementation and associated with     high potassium food as she described above in HPI.  The patient     also seems to be dehydrated as well clinically; however, there is     no chest x-ray to evaluate her lungs.  I will go ahead and order a     chest x-ray.  I will hold her diuretics for now and we will     consider gentle hydration if her chest x-ray is clear. 3. Acute on chronic kidney injury.  The patient has stage 3 CKD with      baseline creatinine of 1.4 and also her urinalysis showed     proteinuria of 100 mg/dL.  Her chronic disease is possibly     secondary to hypertensive nephrosclerosis.  Acute component is     possibly secondary to hemodynamic changes with diuresis and     possibly dehydration. 4. Nausea/change in taste most likely secondary to mild uremic     symptoms.  However, given the discomfort in epigastric right upper     quadrant area and history of cholelithiasis, I will also go ahead     and order right upper quadrant sonogram to  rule out any acute     cholecystitis, which is unlikely, however a possibility. 5. For atrial fibrillation, we will continue with metoprolol and     Coumadin.  We will ask pharmacy to dose. 6. For hypertension, we will continue with clonidine, diltiazem, and     metoprolol, adjust doses for optimal blood pressure control. 7. The patient counseled on diet.  She will need to be on a strict low-     potassium diet obviously that will need to be reinforced as well on     discharge, given the fact that the patient have multiple recurrent     hyperkalemia. 8. Anion gap metabolic acidosis secondary to renal failure, mild.  The     patient received sodium bicarbonate in the ED.  We will continue to     observe and we can also consider adding sodium bicarbonate to IV     fluids if her chest x-ray is clear. 9. History of breast cancer.  The patient to follow with Dr. Darnelle Catalan     as an outpatient.  She did not receive any chemotherapy or     radiation therapy yet as per her. 10.The patient will be admitted to telemetry floor . 11.Code status discussed with the patient in detail.  She is a full     code.          ______________________________ Baltazar Najjar, MD     SA/MEDQ  D:  12/18/2010  T:  12/18/2010  Job:  [id number]  cc:   [facility].  Lyn Records, M.D. Fax: [phone]  Electronically Signed by Hannah Beat MD on 02/05/2011 07:21:08 PM

## 2011-02-26 ENCOUNTER — Emergency Department (HOSPITAL_COMMUNITY)
Admission: EM | Admit: 2011-02-26 | Discharge: 2011-02-26 | Disposition: A | Discharge status: Home / Self Care | Payer: Medicare Other | Attending: Emergency Medicine | Admitting: Emergency Medicine

## 2011-02-26 DIAGNOSIS — Z853 Personal history of malignant neoplasm of breast: Secondary | ICD-10-CM | POA: Insufficient documentation

## 2011-02-26 DIAGNOSIS — I10 Essential (primary) hypertension: Secondary | ICD-10-CM | POA: Insufficient documentation

## 2011-02-26 DIAGNOSIS — M25469 Effusion, unspecified knee: Secondary | ICD-10-CM | POA: Insufficient documentation

## 2011-02-26 DIAGNOSIS — Z7901 Long term (current) use of anticoagulants: Secondary | ICD-10-CM | POA: Insufficient documentation

## 2011-02-26 DIAGNOSIS — I4891 Unspecified atrial fibrillation: Secondary | ICD-10-CM | POA: Insufficient documentation

## 2011-03-25 ENCOUNTER — Ambulatory Visit
Admission: RE | Admit: 2011-03-25 | Discharge: 2011-03-25 | Disposition: A | Discharge status: Home / Self Care | Payer: Medicare Other | Source: Ambulatory Visit | Attending: Radiation Oncology | Admitting: Radiation Oncology

## 2011-03-25 ENCOUNTER — Encounter: Payer: Self-pay | Admitting: Radiation Oncology

## 2011-03-25 VITALS — BP 136/70 | HR 56 | Temp 98.4°F | Resp 18 | Wt 153.6 lb

## 2011-03-25 DIAGNOSIS — C50919 Malignant neoplasm of unspecified site of unspecified female breast: Secondary | ICD-10-CM

## 2011-03-25 NOTE — Progress Notes (Signed)
IN ED , SHE THINKS IN October FOR PAIN IN RIGHT KNEE.  GAVE INJECTION IN KNEE AND SET HER UP TO SEE ORTHOPEDIC DOCTOR.  HE SAID IT WAS ARTHRITIS AND GAVE HER INJECTION IN KNEE AND SHE HAS F/U WITH HIM IN January.   NOTED DRY DESQUAMATION ON RIGHT BREAST BUT OTHERWISE SKIN OK

## 2011-03-25 NOTE — Progress Notes (Signed)
Please see the Nurse Progress Note in the MD Initial Consult Encounter for this patient. 

## 2011-03-25 NOTE — Progress Notes (Signed)
Encounter addended by: Amanda Pea, RN on: 03/25/2011  5:44 PM<BR>     Documentation filed: Inpatient Patient Education

## 2011-03-25 NOTE — Progress Notes (Signed)
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 She is without complaints today. She tells me she will see Dr. [name] for a followup visit on November 29. She does not have a followup with Dr. Darnelle Catalan.  Physical examination: Head and neck examination grossly unremarkable. Nodes: There is no palpable cervical, supraclavicular, or axillary lymphadenopathy. Chest: Lungs clear. Breasts: There is marked hyperpigmentation the skin along the right breast with patchy dry desquamation. There is slight thickening of the right breast, but no masses are appreciated. Left breast without masses or lesions. Extremities: Without edema.  Impression: Satisfactory progress.  Plan: She'll maintain her followup with Dr. Claud Kelp. She ordinarily gets her mammography in May, I think it'll be appropriate for Dr. [name] to schedule her next mammography in May. This will serve as a baseline study on the right and a screening study on the left. I'm not scheduled Mrs. Welcome for a formal followup visit.

## 2011-03-27 ENCOUNTER — Encounter (INDEPENDENT_AMBULATORY_CARE_PROVIDER_SITE_OTHER): Payer: Self-pay | Admitting: General Surgery

## 2011-03-27 ENCOUNTER — Ambulatory Visit (INDEPENDENT_AMBULATORY_CARE_PROVIDER_SITE_OTHER): Payer: Medicare Other | Admitting: General Surgery

## 2011-03-27 VITALS — BP 136/84 | HR 66 | Temp 97.5°F | Resp 14 | Ht 62.0 in | Wt 150.8 lb

## 2011-03-27 DIAGNOSIS — Z853 Personal history of malignant neoplasm of breast: Secondary | ICD-10-CM

## 2011-03-27 NOTE — Progress Notes (Signed)
Patient ID: [name], female   DOB: 1933/10/21, 77 y.o.   MRN: [id number]  Chief Complaint  Patient presents with  . Routine Post Op    Breast recheck    HPI [name] is a 77 y.o. female.    [name] has numerous medical problems. She underwent right partial mastectomy with needle localization on November 16, 2010.  Her tumor was 1.7 cm, poor differentiated, triple negative, pathologically stage T1c., N0. Dr. Darnelle Catalan has been involved in her care and because of her extensive comorbidities did not feel the need for lymph node dissection and apparently does not plan any adjuvant chemotherapy.  The patient feels well. She has no complaints about her breast. She has no pain. She says the radiation changes are subsiding. She has been discharged by Dr. Chipper Herb.  HPI  Past Medical History  Diagnosis Date  . Hypertension   . GERD (gastroesophageal reflux disease)   . Glaucoma   . Cataract   . Breast cancer     right  . Valvular heart disease   . Hyperlipidemia     Past Surgical History  Procedure Date  . Cardiac valve replacement 2001    per medical history form dated 10/21/10  . Appendectomy 1980's    Per patient son. not sure of date.  . Tubal ligation     Family History  Problem Relation Age of Onset  . Hypertension Mother   . Other Mother     Alzheimers  . Cancer Father     leukemia  . Hypertension Brother   . Other Brother     breathing problems    Social History History  Substance Use Topics  . Smoking status: Never Smoker   . Smokeless tobacco: Never Used  . Alcohol Use: No    No Known Allergies  Current Outpatient Prescriptions  Medication Sig Dispense Refill  . diltiazem (CARDIZEM CD) 240 MG 24 hr capsule Daily.      Marland Kitchen KLOR-CON 10 10 MEQ CR tablet       . torsemide (DEMADEX) 20 MG tablet       . AMLODIPINE BESYLATE PO Take 10 mg by mouth daily.        Marland Kitchen amoxicillin (AMOXIL) 500 MG tablet Take 500 mg by mouth 2 (two) times daily.         . Calcium Carbonate-Vitamin D ([employer]S CALCIUM 600 + D PO) Take by mouth 2 (two) times daily.        . clarithromycin (BIAXIN) 500 MG tablet Take 500 mg by mouth 1 dose over 24 hours.        . cloNIDine (CATAPRES) 0.1 MG tablet Take 0.1 mg by mouth 2 (two) times daily.        Marland Kitchen eplerenone (INSPRA) 25 MG tablet Take 25 mg by mouth daily.        . furosemide (LASIX) 20 MG tablet Take 20 mg by mouth daily.        Marland Kitchen lisinopril (PRINIVIL,ZESTRIL) 20 MG tablet Take 20 mg by mouth daily.        . metoprolol (TOPROL-XL) 100 MG 24 hr tablet Take 100 mg by mouth daily.        Marland Kitchen omeprazole (PRILOSEC) 20 MG capsule Take 20 mg by mouth 2 (two) times daily.        . potassium chloride SA (K-DUR,KLOR-CON) 20 MEQ tablet Take 20 mEq by mouth daily.        . simvastatin (ZOCOR) 40 MG  tablet Take 40 mg by mouth at bedtime.        . travoprost, benzalkonium, (TRAVATAN) 0.004 % ophthalmic solution Place 1 drop into both eyes at bedtime.        . WARFARIN SODIUM PO Take 5 mg by mouth 2 (two) times daily.          Review of Systems Review of Systems 12 system review of systems is performed and is negative except as described above. Blood pressure 136/84, pulse 66, temperature 97.5 F (36.4 C), temperature source Temporal, resp. rate 14, height 5\' 2"  (1.575 m), weight 150 lb 12.8 oz (68.402 kg).  Physical Exam Physical Exam  Constitutional: She is oriented to person, place, and time. She appears well-nourished. No distress.  Neck: Normal range of motion. Neck supple. No JVD present.  Cardiovascular: Normal rate, regular rhythm and normal heart sounds.   Pulmonary/Chest: Effort normal and breath sounds normal. No respiratory distress. She has no wheezes.    Musculoskeletal: Normal range of motion. She exhibits no edema and no tenderness.  Lymphadenopathy:    She has no cervical adenopathy.  Neurological: She is alert and oriented to person, place, and time.  Skin: Skin is warm and dry. No rash noted. She is  not diaphoretic. No erythema. No pallor.  Psychiatric: She has a normal mood and affect. Her behavior is normal. Judgment and thought content normal.    Data Reviewed I have reviewed all the notes from the [facility]. Assessment    Invasive ductal carcinoma, right breast, 12:00 position, triple negative tumor, pathologic stage T1c, N0, poorly differentiated.  Uneventful recovery 4 months following right partial mastectomy with needle localization.  Recovering uneventfully following adjuvant radiation therapy which was completed approximately one month ago.  Atrial fibrillation on Coumadin  Status post aortic valve replacement  Congestive heart failure  Chronic renal insufficiency    Plan    I advised the patient to followup with Dr. Marikay Alar Magrinat.  She was advised to get bilateral mammograms in May of 2013.  Return to see me in 6 months after the mammograms are done.       Ranveer Wahlstrom M 03/27/2011, 11:51 AM

## 2011-03-27 NOTE — Patient Instructions (Signed)
Your right breast incision is well healed. There are no signs of cancer on exam today. Please arrange for followup with Dr. Marikay Alar Magrinat. Be sure to get your mammograms in May of 2013. Return to see me in 6 months after the mammograms are done.

## 2011-05-31 ENCOUNTER — Telehealth: Payer: Self-pay | Admitting: Oncology

## 2011-05-31 NOTE — Telephone Encounter (Signed)
S/w pt re appts for feb/aug. Pt will get schedule when she comes in 2/13.

## 2011-06-11 ENCOUNTER — Encounter: Payer: Self-pay | Admitting: Physician Assistant

## 2011-06-11 ENCOUNTER — Other Ambulatory Visit (HOSPITAL_BASED_OUTPATIENT_CLINIC_OR_DEPARTMENT_OTHER): Payer: Medicare Other | Admitting: Lab

## 2011-06-11 ENCOUNTER — Ambulatory Visit (HOSPITAL_BASED_OUTPATIENT_CLINIC_OR_DEPARTMENT_OTHER): Payer: Medicare Other | Admitting: Physician Assistant

## 2011-06-11 ENCOUNTER — Telehealth: Payer: Self-pay | Admitting: *Deleted

## 2011-06-11 VITALS — BP 145/76 | HR 62 | Temp 98.4°F | Wt 153.1 lb

## 2011-06-11 DIAGNOSIS — C50219 Malignant neoplasm of upper-inner quadrant of unspecified female breast: Secondary | ICD-10-CM

## 2011-06-11 DIAGNOSIS — C50919 Malignant neoplasm of unspecified site of unspecified female breast: Secondary | ICD-10-CM

## 2011-06-11 LAB — CBC WITH DIFFERENTIAL/PLATELET
Basophils Absolute: 0 10*3/uL (ref 0.0–0.1)
Eosinophils Absolute: 0.2 10*3/uL (ref 0.0–0.5)
HGB: 10.5 g/dL — ABNORMAL LOW (ref 11.6–15.9)
LYMPH%: 8.3 % — ABNORMAL LOW (ref 14.0–49.7)
MCV: 91 fL (ref 79.5–101.0)
MONO%: 8.1 % (ref 0.0–14.0)
NEUT#: 7.3 10*3/uL — ABNORMAL HIGH (ref 1.5–6.5)
Platelets: 176 10*3/uL (ref 145–400)

## 2011-06-11 NOTE — Progress Notes (Signed)
Hematology and Oncology Follow Up Visit  [name] [id number] 02-09-1934 78 y.o. 06/11/2011    HPI: The patient had routine yearly screening mammography Sep 24, 2010, at [name].  This suggested a possible asymmetry in the right breast, so the patient proceeded to additional views October 01, 2010.  The mass noted in screening persisted on additional images.  There were irregular margins.  By ultrasonography, this was an irregular hypoechoic mass measuring 1.5 cm.  There was no evidence of axillary adenopathy.  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  The MIB-1 was 90%.   With this information, the patient was referred to Dr. Derrell Ramirez and bilateral breast MRIs were obtained October 15, 2010, at [name].  This confirmed a 1.4-cm irregular enhancing mass in the upper central right breast.  There were no other suspicious areas, no axillary or internal mammary chain adenopathy, and no suspicious enhancement or changes in the left breast.  Incidental note is of a 4.5-cm left hepatic cyst and 2 smaller likely liver cysts were noted.   [name] underwent a right lumpectomy under the care of Dr. Derrell Ramirez on 11/15/2010. Proceeded to radiation therapy, completed in October 2012. She's now followed with observation alone.  Interim History:   [name] returns today for routine followup of her right breast carcinoma. She completed radiation in October and has recovered well. She has multiple comorbidities, and in fact, interval history is remarkable for hospitalization in September, unrelated to her breast cancer diagnosis. She continues to have chronic cardiac issues, including acute on chronic diastolic heart failure and atrial fibrillation.  She continues on Coumadin, followed through Dr. Michaelle Ramirez office.  [name]  her recent "cold" and has some residual sinus congestion. She has an occasional cough, sometimes productive of white phlegm. She denies hemoptysis, and has had no fevers, chills, night sweats, pleurisy, or increased shortness of breath.  A detailed review of systems is otherwise noncontributory as noted below.  Review of Systems: Constitutional:  no weight loss, fever, night sweats Eyes: uses glasses [name] Cardiovascular: no chest pain or dyspnea on exertion Respiratory: positive for - cough negative for - hemoptysis, orthopnea, pleuritic pain or shortness of breath Neurological: no TIA or stroke symptoms negative Dermatological: negative Gastrointestinal: no abdominal pain, change in bowel habits, or black or bloody stools Genito-Urinary: no dysuria, trouble voiding, or hematuria Hematological and Lymphatic: negative Breast: negative Musculoskeletal: negative Remaining ROS negative.  PAST MEDICAL HISTORY:  Significant for cardiac valve repair/replacement in 2001.  I do not have those records.  The patient has been on Coumadin since that time, followed through [name].  She has cardiomegaly by MRI.  She has a history of hypertension, cataracts, glaucoma, and she has problems with ambulation due to damage to her feet, felt to be secondary to working on a concrete floor for many years with inadequate shoes.  She is status post appendectomy and status post bilateral tubal ligation.  There is also a history of atrial fibrillation and history of diastolic dysfunction with some evidence of failure, history of hyperlipidemia, history of chronic venous insufficiency, and history of acute renal failure (please refer to the admission history by Dr. Mikeal Ramirez in e-Chart).  FAMILY HISTORY:  The patient's mother is alive at age 60.  She has significant Alzheimer disease.  She mostly lives with the patient.  The  patient's father died from leukemia at the age of 42.  The patient had no  sisters.  She had 3 brothers.  One died, but she does not know the cause.  The other 2 are living; 1 has emphysema, 1 has gout and high blood pressure.  GYNECOLOGIC HISTORY:  She is Gx P6.  First pregnancy to term age 5.  She does not remember when she had menarche.  She went through the change of life around age 52.  She never took hormone replacement.  SOCIAL HISTORY:  She used to work as a [name] at [employer].  She is widowed, her husband dying a year ago from emphysema problems.  At home it is just her mother, who has stated above is 47 years old and demented.  Her son, [name] sometimes stays there as well.  He is disabled also secondary to burns.  With her today is her son, [name], who works as a [name].  His cell number is [phone].  The patient's daughter, [name] works in Designer, fashion/clothing.  Her home number is [phone].  In case of an emergency, [name] would want [place] to contact one or both of them.  [name] has 5 grandchildren.  She attends [name].  Medications:   I have reviewed the patient's current medications.  Current Outpatient Prescriptions  Medication Sig Dispense Refill  . AMLODIPINE BESYLATE PO Take 10 mg by mouth daily.        . Calcium Carbonate-Vitamin D (CVS CALCIUM 600 + D PO) Take by mouth 2 (two) times daily.        Marland Kitchen diltiazem (CARDIZEM CD) 240 MG 24 hr capsule Daily.      . febuxostat (ULORIC) 40 MG tablet Take 40 mg by mouth daily.      Marland Kitchen KLOR-CON 10 10 MEQ CR tablet       . metoprolol (TOPROL-XL) 100 MG 24 hr tablet Take 100 mg by mouth daily.        . potassium chloride SA (K-DUR,KLOR-CON) 20 MEQ tablet Take 20 mEq by mouth daily.        Marland Kitchen torsemide (DEMADEX) 20 MG tablet       . WARFARIN SODIUM PO Take 5 mg by mouth 2 (two) times daily.        Marland Kitchen omeprazole (PRILOSEC) 20 MG capsule Take 20 mg by mouth 2 (two) times daily.          Allergies: No Known Allergies  Physical Exam: Filed Vitals:   06/11/11 1339    BP: 145/76  Pulse: 62  Temp: 98.4 F (36.9 C)   HEENT:  Sclerae anicteric, conjunctivae pink.  Oropharynx clear.  No mucositis or candidiasis.   Nodes:  No cervical, supraclavicular, or axillary lymphadenopathy palpated.  Breast Exam: Right breast is status post lumpectomy with well-healed incision. No suspicious nodularity or skin changes. No evidence of local recurrence. Left breast is benign with  no masses, skin changes, nipple inversion. Lungs:  Clear to auscultation bilaterally.  No crackles, rhonchi, or wheezes.   Heart:   Regular rate with midsystolic murmur  Abdomen:  Soft, nontender.  Positive bowel sounds.  No organomegaly or masses palpated.   Musculoskeletal:  No focal spinal tenderness to palpation.  Extremities:  Benign.  No peripheral edema or cyanosis.   Skin:  Benign.   Neuro:  Nonfocal.   Lab Results: Lab Results  Component Value Date   WBC 8.9 06/11/2011   HGB 10.5* 06/11/2011  HCT 31.9* 06/11/2011   MCV 91.0 06/11/2011   PLT 176 06/11/2011   NEUTROABS 7.3* 06/11/2011     Chemistry      Component Value Date/Time   NA 137 01/07/2011 0505   K 3.3* 01/07/2011 0505   CL 97 01/07/2011 0505   CO2 31 01/07/2011 0505   BUN 32* 01/07/2011 0505   CREATININE 1.39* 01/07/2011 0505      Component Value Date/Time   CALCIUM 9.4 01/07/2011 0505   ALKPHOS 130* 01/03/2011 0144   AST 24 01/03/2011 0144   ALT 18 01/03/2011 0144   BILITOT 0.5 01/03/2011 0144        Radiological Studies:  No results found.   Assessment:  A 78 year old [place] woman status post right lumpectomy with no sentinel lymph node sampling July 20 for a 1.7-cm high-grade invasive ductal carcinoma, triple negative, with an MIB-1 of 90%. Status post radiation therapy, completed in October 2012.  Plan:  Brain is doing well with regards to her breast cancer, and there is no evidence of clinical recurrence at this time. She'll be due for her next bilateral mammogram at [facility] in May of 2013, and will return to  see [place] in 6 months, August of 2013. We will plan on a CBC and physical exam at that visit.   This plan was reviewed with the patient, who voices understanding and agreement.  She knows to call with any changes or problems.    Fiora Weill, PA-C 06/11/2011

## 2011-06-11 NOTE — Telephone Encounter (Signed)
gave patient appointment for 11-2011 made patient appointment for mammogram at [facility] on 09-25-2011 at 1:00pm

## 2011-08-06 ENCOUNTER — Encounter (INDEPENDENT_AMBULATORY_CARE_PROVIDER_SITE_OTHER): Payer: Self-pay | Admitting: General Surgery

## 2011-09-18 ENCOUNTER — Encounter (INDEPENDENT_AMBULATORY_CARE_PROVIDER_SITE_OTHER): Payer: Self-pay | Admitting: General Surgery

## 2011-09-18 ENCOUNTER — Ambulatory Visit (INDEPENDENT_AMBULATORY_CARE_PROVIDER_SITE_OTHER): Payer: Medicare Other | Admitting: General Surgery

## 2011-09-18 VITALS — BP 118/86 | HR 78 | Temp 97.9°F | Resp 16 | Ht 62.0 in | Wt 159.2 lb

## 2011-09-18 DIAGNOSIS — C50919 Malignant neoplasm of unspecified site of unspecified female breast: Secondary | ICD-10-CM

## 2011-09-18 NOTE — Patient Instructions (Signed)
Your exam today is normal. There are some radiation changes in the right breast, but there is no evidence of any cancer.  Be sure to keep your appointment for your mammograms at [facility] in July 2013.  Return to see Dr. [name] in July 2014 after  Your annual mammograms.

## 2011-09-18 NOTE — Progress Notes (Signed)
Subjective:     Patient ID: [name], female   DOB: July 03, 1933, 78 y.o.   MRN: [id number]  HPI This patient underwent right partial mastectomy with needle localization on November 16, 2010. She had a poor differentiated, triple negative, 1.7 cm invasive adenocarcinoma, pathologic stage TI C., N0. We elected not to do sentinel lymph node biopsy because of  her numerous comorbidities and Dr. Darnelle Catalan did not feel that she would tolerate chemo- therapy. She has completed adjuvant radiation therapy and has been stable since I last saw her.  She is scheduled for mammograms in July of this year.  She has no complaint about her breast.  Review of Systems     Objective:   Physical Exam The patient is alert. She is ambulatory with a cane. She is in no distress.  Neck no adenopathy mass or jugular venous distention  Lungs clear to auscultation. Well-healed sternotomy scar  Heart:   Regular rate and rhythm. Aortic sounds prominent  Breast:  right breast shows radiation change with reduction in size and skin thickening and tanning. Is nontender. There is no palpable mass no axillary adenopathy. Left breast is atrophic and ptotic.   reveals no palpable mass, no skin changes, no axillary adenopathy. Range of motion right shoulder is excellent. There is no right arm swelling.    Assessment:     Poorly differentiated, triple negative invasive ductal carcinoma right breast, superior central aspect, 1.7 cm tumor, T1 C., N0.  No evidence of recurrence 10 months following right partial mastectomy, needle localization and adjuvant radiation therapy.  Atrial fibrillation on Coumadin  Status post aortic valve replacement  Congestive heart failure  Chronic renal insufficiency.    Plan:     Continue regular followup with Dr. Marikay Alar Magrinat as well as her primary care physician  Bilateral mammograms in July 2013  Bilateral mammograms July 2014  Return to see me in July 2014.   [name].  [name], M.D., [facility] General and Minimally invasive Surgery Breast and Colorectal Surgery Office:   [phone] Pager:   [phone]

## 2011-11-21 ENCOUNTER — Encounter (HOSPITAL_COMMUNITY): Payer: Self-pay | Admitting: Internal Medicine

## 2011-11-21 ENCOUNTER — Inpatient Hospital Stay (HOSPITAL_COMMUNITY)
Admission: EM | Admit: 2011-11-21 | Discharge: 2011-11-23 | DRG: 309 | Disposition: A | Discharge status: Home / Self Care | Payer: Medicare Other | Attending: Family Medicine | Admitting: Family Medicine

## 2011-11-21 ENCOUNTER — Emergency Department (HOSPITAL_COMMUNITY): Payer: Medicare Other

## 2011-11-21 DIAGNOSIS — R5381 Other malaise: Secondary | ICD-10-CM

## 2011-11-21 DIAGNOSIS — I5032 Chronic diastolic (congestive) heart failure: Secondary | ICD-10-CM

## 2011-11-21 DIAGNOSIS — N039 Chronic nephritic syndrome with unspecified morphologic changes: Secondary | ICD-10-CM | POA: Diagnosis present

## 2011-11-21 DIAGNOSIS — I129 Hypertensive chronic kidney disease with stage 1 through stage 4 chronic kidney disease, or unspecified chronic kidney disease: Secondary | ICD-10-CM | POA: Diagnosis present

## 2011-11-21 DIAGNOSIS — L97909 Non-pressure chronic ulcer of unspecified part of unspecified lower leg with unspecified severity: Secondary | ICD-10-CM | POA: Diagnosis present

## 2011-11-21 DIAGNOSIS — N289 Disorder of kidney and ureter, unspecified: Secondary | ICD-10-CM

## 2011-11-21 DIAGNOSIS — E860 Dehydration: Secondary | ICD-10-CM

## 2011-11-21 DIAGNOSIS — C50919 Malignant neoplasm of unspecified site of unspecified female breast: Secondary | ICD-10-CM

## 2011-11-21 DIAGNOSIS — R5383 Other fatigue: Secondary | ICD-10-CM

## 2011-11-21 DIAGNOSIS — Z954 Presence of other heart-valve replacement: Secondary | ICD-10-CM

## 2011-11-21 DIAGNOSIS — R001 Bradycardia, unspecified: Secondary | ICD-10-CM

## 2011-11-21 DIAGNOSIS — Z7901 Long term (current) use of anticoagulants: Secondary | ICD-10-CM

## 2011-11-21 DIAGNOSIS — I509 Heart failure, unspecified: Secondary | ICD-10-CM | POA: Diagnosis present

## 2011-11-21 DIAGNOSIS — I2789 Other specified pulmonary heart diseases: Secondary | ICD-10-CM | POA: Diagnosis present

## 2011-11-21 DIAGNOSIS — I498 Other specified cardiac arrhythmias: Principal | ICD-10-CM | POA: Diagnosis present

## 2011-11-21 DIAGNOSIS — N179 Acute kidney failure, unspecified: Secondary | ICD-10-CM

## 2011-11-21 DIAGNOSIS — T50995A Adverse effect of other drugs, medicaments and biological substances, initial encounter: Secondary | ICD-10-CM | POA: Diagnosis present

## 2011-11-21 DIAGNOSIS — I10 Essential (primary) hypertension: Secondary | ICD-10-CM

## 2011-11-21 DIAGNOSIS — N183 Chronic kidney disease, stage 3 unspecified: Secondary | ICD-10-CM

## 2011-11-21 DIAGNOSIS — E875 Hyperkalemia: Secondary | ICD-10-CM

## 2011-11-21 DIAGNOSIS — D631 Anemia in chronic kidney disease: Secondary | ICD-10-CM | POA: Diagnosis present

## 2011-11-21 DIAGNOSIS — I4891 Unspecified atrial fibrillation: Secondary | ICD-10-CM

## 2011-11-21 DIAGNOSIS — M109 Gout, unspecified: Secondary | ICD-10-CM

## 2011-11-21 DIAGNOSIS — I872 Venous insufficiency (chronic) (peripheral): Secondary | ICD-10-CM | POA: Diagnosis present

## 2011-11-21 DIAGNOSIS — D649 Anemia, unspecified: Secondary | ICD-10-CM | POA: Diagnosis present

## 2011-11-21 DIAGNOSIS — Z9889 Other specified postprocedural states: Secondary | ICD-10-CM

## 2011-11-21 HISTORY — DX: Gout, unspecified: M10.9

## 2011-11-21 LAB — CBC WITH DIFFERENTIAL/PLATELET
Basophils Absolute: 0 10*3/uL (ref 0.0–0.1)
Basophils Relative: 0 % (ref 0–1)
Eosinophils Absolute: 0.1 10*3/uL (ref 0.0–0.7)
Eosinophils Relative: 2 % (ref 0–5)
Lymphs Abs: 0.6 10*3/uL — ABNORMAL LOW (ref 0.7–4.0)
MCH: 27.8 pg (ref 26.0–34.0)
MCHC: 31.4 g/dL (ref 30.0–36.0)
MCV: 88.6 fL (ref 78.0–100.0)
Platelets: 134 10*3/uL — ABNORMAL LOW (ref 150–400)
RDW: 18.8 % — ABNORMAL HIGH (ref 11.5–15.5)

## 2011-11-21 LAB — BASIC METABOLIC PANEL
Calcium: 9 mg/dL (ref 8.4–10.5)
GFR calc non Af Amer: 27 mL/min — ABNORMAL LOW (ref >90)
Glucose, Bld: 86 mg/dL (ref 70–99)
Sodium: 137 mEq/L (ref 135–145)

## 2011-11-21 LAB — URINALYSIS, ROUTINE W REFLEX MICROSCOPIC
Hgb urine dipstick: NEGATIVE
Nitrite: NEGATIVE
Protein, ur: 100 mg/dL — AB
Urobilinogen, UA: 0.2 mg/dL (ref 0.0–1.0)

## 2011-11-21 LAB — URINE MICROSCOPIC-ADD ON

## 2011-11-21 LAB — PROTIME-INR
INR: 2.1 — ABNORMAL HIGH (ref 0.00–1.49)
Prothrombin Time: 23.9 seconds — ABNORMAL HIGH (ref 11.6–15.2)

## 2011-11-21 LAB — POCT I-STAT TROPONIN I: Troponin i, poc: 0.04 ng/mL (ref 0.00–0.08)

## 2011-11-21 MED ORDER — ONDANSETRON HCL 4 MG/2ML IJ SOLN: 4.0000 mg | Freq: Four times a day (QID) | INTRAMUSCULAR | Status: DC | PRN

## 2011-11-21 MED ORDER — WARFARIN - PHARMACIST DOSING INPATIENT: Freq: Every day | Status: DC

## 2011-11-21 MED ORDER — HYDRALAZINE HCL 20 MG/ML IJ SOLN
10.0000 mg | Freq: Four times a day (QID) | INTRAMUSCULAR | Status: DC | PRN
  Administered 2011-11-21 – 2011-11-22 (×2): 10 mg via INTRAVENOUS
  Filled 2011-11-21 (×2): qty 1

## 2011-11-21 MED ORDER — WARFARIN SODIUM 2.5 MG PO TABS
5.0000 mg | ORAL_TABLET | Freq: Once | ORAL | Status: DC
  Filled 2011-11-21: qty 2

## 2011-11-21 MED ORDER — WARFARIN SODIUM 10 MG PO TABS: 10.0000 mg | ORAL_TABLET | Freq: Every evening | ORAL | Status: DC

## 2011-11-21 MED ORDER — TRAVOPROST (BAK FREE) 0.004 % OP SOLN
1.0000 [drp] | Freq: Every day | OPHTHALMIC | Status: DC
  Administered 2011-11-21 – 2011-11-22 (×2): 1 [drp] via OPHTHALMIC
  Filled 2011-11-21: qty 2.5

## 2011-11-21 MED ORDER — SODIUM POLYSTYRENE SULFONATE 15 GM/60ML PO SUSP
60.0000 g | Freq: Once | ORAL | Status: AC
  Administered 2011-11-21: 60 g via ORAL
  Filled 2011-11-21: qty 60
  Filled 2011-11-21: qty 180

## 2011-11-21 MED ORDER — ALBUTEROL SULFATE (5 MG/ML) 0.5% IN NEBU: 2.5000 mg | INHALATION_SOLUTION | Freq: Four times a day (QID) | RESPIRATORY_TRACT | Status: DC | PRN

## 2011-11-21 MED ORDER — ASPIRIN EC 81 MG PO TBEC
81.0000 mg | DELAYED_RELEASE_TABLET | Freq: Every day | ORAL | Status: DC
  Administered 2011-11-22: 81 mg via ORAL
  Filled 2011-11-21 (×2): qty 1

## 2011-11-21 MED ORDER — ONDANSETRON HCL 4 MG PO TABS: 4.0000 mg | ORAL_TABLET | Freq: Four times a day (QID) | ORAL | Status: DC | PRN

## 2011-11-21 MED ORDER — ACETAMINOPHEN 650 MG RE SUPP: 650.0000 mg | Freq: Four times a day (QID) | RECTAL | Status: DC | PRN

## 2011-11-21 MED ORDER — WARFARIN SODIUM 2.5 MG PO TABS
7.5000 mg | ORAL_TABLET | Freq: Once | ORAL | Status: AC
  Administered 2011-11-21: 7.5 mg via ORAL
  Filled 2011-11-21: qty 3

## 2011-11-21 MED ORDER — SODIUM CHLORIDE 0.9 % IV SOLN
INTRAVENOUS | Status: DC
  Administered 2011-11-21: 17:00:00 via INTRAVENOUS

## 2011-11-21 MED ORDER — ACETAMINOPHEN 325 MG PO TABS
650.0000 mg | ORAL_TABLET | Freq: Four times a day (QID) | ORAL | Status: DC | PRN
  Administered 2011-11-22 – 2011-11-23 (×2): 650 mg via ORAL
  Filled 2011-11-21 (×2): qty 2

## 2011-11-21 MED ORDER — TORSEMIDE 20 MG PO TABS
40.0000 mg | ORAL_TABLET | Freq: Every day | ORAL | Status: DC
  Administered 2011-11-21: 40 mg via ORAL
  Filled 2011-11-21 (×2): qty 2

## 2011-11-21 NOTE — ED Notes (Signed)
Pt states that she became SOB and dizzy today around 2 oclock. States that she had this happen before when her potassium was too high. Took all of her meds today aside from coumadin. No cough or recent illness. HR in low 40s, 30s.

## 2011-11-21 NOTE — H&P (Signed)
[facility] History and Physical  [name]:[id number] DOB: 07-23-1933 DOA: 11/21/2011  Referring physician: Dr. Garret Reddish PCP: Henri Medal, MD  Cardiologist: Dr. Verdis Prime  Chief Complaint: Weakness, shortness of breath  HPI:  This is a pleasant 78 year old female with a history of chronic diastolic congestive heart failure, chronic atrial fibrillation, history of mitral valve repair in the past. Patient lives at home. She reports that for the past today she is still somewhat weak and short of breath. This significantly got worse on the day of admission. She denies any chest pain, lightheadedness, nausea. She denies any orthopnea, she's been checking her weights and she reports it unchanged over the past week, she has not noticed any worsening edema. She is brought to the emergency room where he was noted that she her heart rate ranging in the 30s to 40s. Patient is on both metoprolol and diltiazem for rate control of atrial fibrillation. She does not report taking any extra medication. She reports compliance with her medication and her diet. She's not had any fever, cough, nausea, vomiting, diarrhea, abdominal pain, dysuria. She's been referred for admission for further evaluation.  Review of Systems:  As per HPI, otherwise negative  Past Medical History  Diagnosis Date  . Hypertension   . GERD (gastroesophageal reflux disease)   . Glaucoma   . Cataract   . Breast cancer     right  . Valvular heart disease   . Hyperlipidemia    Past Surgical History  Procedure Date  . Cardiac valve replacement 2001    per medical history form dated 10/21/10  . Appendectomy 1980's    Per patient son. not sure of date.  . Tubal ligation   . Breast surgery 2012    Right br. Lumpectomy   Social History:  reports that she has never smoked. She has never used smokeless tobacco. She reports that she does not drink alcohol or use illicit drugs. Lives at home, ambulates with  the assistance of a cane  No Known Allergies  Family History  Problem Relation Age of Onset  . Hypertension Mother   . Other Mother     Alzheimers  . Cancer Father     leukemia  . Hypertension Brother   . Other Brother     breathing problems  reviewed, as above.  Prior to Admission medications   Medication Sig Start Date End Date Taking? Authorizing Provider  Calcium Carbonate-Vitamin D ([facility] CALCIUM 600 + D PO) Take by mouth 2 (two) times daily.     Yes Historical Provider, MD  diltiazem (CARDIZEM CD) 240 MG 24 hr capsule Take 240 mg by mouth Daily.  03/12/11  Yes Historical Provider, MD  febuxostat (ULORIC) 40 MG tablet Take 40 mg by mouth daily.   Yes Henri Medal, MD  metoprolol (TOPROL-XL) 100 MG 24 hr tablet Take 100 mg by mouth daily.    Yes Historical Provider, MD  potassium chloride (K-DUR,KLOR-CON) 10 MEQ tablet Take 10 mEq by mouth daily.   Yes Historical Provider, MD  torsemide (DEMADEX) 20 MG tablet Take 40 mg by mouth daily.  03/12/11  Yes Historical Provider, MD  TRAVATAN Z 0.004 % SOLN ophthalmic solution Place 1 drop into both eyes at bedtime.  09/08/11  Yes Historical Provider, MD  warfarin (COUMADIN) 5 MG tablet Take 10-15 mg by mouth every evening. Take 10 mg on Sunday and Tuesday. Take 15 mg Monday, Wednesday, Thursday, Friday,Saturday   Yes Historical Provider, MD   Physical  Exam: Filed Vitals:   11/21/11 1719 11/21/11 1720 11/21/11 1721 11/21/11 1740  BP:  144/61  158/49  Pulse: 40 39 40 103  Temp:      TempSrc:      Resp:  18  15  SpO2:  100%  100%     General:  Patient is sitting up in bed, does not appear to be in any distress, is able to participate in history  Eyes: Pupils are equal round reactive to light and accommodation  ENT: No pharyngeal erythema, mucous membranes are moist  Neck: Supple  Cardiovascular: S1, S2, bradycardic, trace pedal edema  Respiratory: Clear to auscultation bilaterally  Abdomen: Soft, nontender,  nondistended, bowel sounds are active  Skin: Left lower extremity is wrapped in Ace bandage.  Musculoskeletal: Deferred  Psychiatric: Normal affect, cooperative with exam  Neurologic: Grossly intact, nonfocal  Labs on Admission:  Basic Metabolic Panel:  Lab 11/21/11 1610  NA 137  K 5.6*  CL 101  CO2 22  GLUCOSE 86  BUN 51*  CREATININE 1.74*  CALCIUM 9.0  MG --  PHOS --   Liver Function Tests: No results found for this basename: AST:5,ALT:5,ALKPHOS:5,BILITOT:5,PROT:5,ALBUMIN:5 in the last 168 hours No results found for this basename: LIPASE:5,AMYLASE:5 in the last 168 hours No results found for this basename: AMMONIA:5 in the last 168 hours CBC:  Lab 11/21/11 1703  WBC 7.8  NEUTROABS 6.4  HGB 11.5*  HCT 36.6  MCV 88.6  PLT 134*   Cardiac Enzymes: No results found for this basename: CKTOTAL:5,CKMB:5,CKMBINDEX:5,TROPONINI:5 in the last 168 hours  BNP (last 3 results)  Basename 01/05/11 0420 01/03/11 0144 12/19/10 1122  PROBNP 6465.0* 8316.0* 7849.0*   CBG: No results found for this basename: GLUCAP:5 in the last 168 hours  Radiological Exams on Admission: Dg Chest Port 1 View  11/21/2011  *RADIOLOGY REPORT*  Clinical Data: 78 year old female with shortness of breath.  PORTABLE CHEST - 1 VIEW  Comparison: 01/03/2011 chest radiograph  Findings: Cardiomegaly, cardiac surgical changes, pulmonary vascular congestion and probable interstitial pulmonary edema noted. There may be a small right pleural effusion present. There is no evidence of pneumothorax. No acute or suspicious bony abnormalities are noted.  IMPRESSION: Cardiomegaly with pulmonary vascular congestion and probable interstitial edema.  Question small right pleural effusion.  Original Report Authenticated By: Rosendo Gros, M.D.    EKG: Independently reviewed. Atrial fibrillation with a slow ventricular response  Assessment/Plan Principal Problem:  *Bradycardia Active Problems:  Breast cancer  Chronic  diastolic heart failure  Atrial fibrillation  Gout  Chronic anticoagulation  Acute renal failure  Chronic kidney disease, stage 3, mod decreased GFR  Hypertension  H/O mitral valve repair   1. Bradycardia. Possibly iatrogenic from beta blockers and calcium channel blockers. These will be held. Patient has pacer pads applied in case she becomes increasingly bradycardic. Her heart rate is mostly ranging in the 40s but occasionally dropping down into the 30s. She'll be admitted to the step down unit for closer observation. We will cycle her cardiac markers, check 2-D echocardiogram, BNP, thyroid function studies. Her shortness of breath and weakness are likely related to her bradycardia. I have discussed the case with Dr. Mayford Knife with cardiology. They will be following along in consult. 2. Chronic atrial fibrillation. We will continue to hold rate control medications for now, as she is bradycardic. Pharmacy to adjust Coumadin 3. Chronic diastolic congestive heart failure. Patient has not had any overt signs of volume overload. Her creatinine is mildly elevated from  prior levels. We will hold diuretics for now. This can be reassessed in the morning. 4. Acute on chronic kidney disease. This may just be progression of her chronic kidney disease. Although her BUN/creatinine ratio indicates a prerenal cause. She does not appear to be overtly volume overloaded. We will hold diuretics for now and follow renal function. 5. Breast cancer. Patient has a history of breast cancer in the past with lumpectomy and radiation treatments. Per record she is still in remission.  Code Status: Full code, confirmed with patient Family Communication: Discussed with patient and family at bedside Disposition Plan: Admit to the hospital for further evaluation and treatment  Critical care:  Gianny Sabino [facility] Pager [phone]  If 7PM-7AM, please contact night-coverage [email or web address] Password  [facility] 11/21/2011, 7:47 PM

## 2011-11-21 NOTE — Consult Note (Signed)
CARDIOLOGY CONSULT NOTE  Patient ID: [name] MRN: [id number], DOB/AGE: [birth year]   Admit date: 11/21/2011 Date of Consult: 11/21/2011   Primary Physician: Henri Medal, MD Primary Cardiologist:  Dr. Garnette Scheuermann  HPI:  This is a pleasant 78 year old AA female with a history of HFPEF, chronic atrial fibrillation, history of mitral valve repair in the past. Patient lives at home.   At baseline does pretty well able to do all ADLs without too much problem. Saw dr. Katrinka Blazing recently and was doing well.  Today noticed she felt more weak and short of breath. Became dizzy but no syncope. Has not noticed edema, orthopnea or PND. Skipped lasix this am because she was going out but compliant with all other meds. Has not taken extra meds.   She was brought to the emergency room where he was noted tto be in slow AF with heart rate ranging in the 30s to 40s. On tele HR noted to range from 40-54. SBP 130 or greater. Feels fine now.  Patient is on both metoprolol and diltiazem for rate control of atrial fibrillation. CXR with mild pulmonary edema. proBNP not ordered (baseline ~7,000)  She's not had any fever, cough, nausea, vomiting, diarrhea, abdominal pain, dysuria. She was admitted to the hospitalist service for further management and we are asked to consult.   Problem List  Past Medical History  Diagnosis Date  . Hypertension   . GERD (gastroesophageal reflux disease)   . Glaucoma   . Cataract   . Breast cancer     right  . Valvular heart disease   . Hyperlipidemia   . Gout     Past Surgical History  Procedure Date  . Cardiac valve replacement 2001    per medical history form dated 10/21/10  . Appendectomy 1980's    Per patient son. not sure of date.  . Tubal ligation   . Breast surgery 2012    Right br. Lumpectomy     Allergies  No Known Allergies  Inpatient Medications     . sodium polystyrene  60 g Oral Once    No current facility-administered medications  on file prior to encounter.   Current Outpatient Prescriptions on File Prior to Encounter  Medication Sig Dispense Refill  . Calcium Carbonate-Vitamin D ([facility] CALCIUM 600 + D PO) Take by mouth 2 (two) times daily.        Marland Kitchen diltiazem (CARDIZEM CD) 240 MG 24 hr capsule Take 240 mg by mouth Daily.       . febuxostat (ULORIC) 40 MG tablet Take 40 mg by mouth daily.      . metoprolol (TOPROL-XL) 100 MG 24 hr tablet Take 100 mg by mouth daily.       Marland Kitchen torsemide (DEMADEX) 20 MG tablet Take 40 mg by mouth daily.       . TRAVATAN Z 0.004 % SOLN ophthalmic solution Place 1 drop into both eyes at bedtime.          Family History Family History  Problem Relation Age of Onset  . Hypertension Mother   . Other Mother     Alzheimers  . Cancer Father     leukemia  . Hypertension Brother   . Other Brother     breathing problems     Social History History   Social History  . Marital Status: Widowed    Spouse Name: N/A    Number of Children: N/A  . Years of Education: N/A   Occupational History  .  Not on file.   Social History Main Topics  . Smoking status: Never Smoker   . Smokeless tobacco: Never Used  . Alcohol Use: No  . Drug Use: No  . Sexually Active: Not Currently   Other Topics Concern  . Not on file   Social History Narrative  . No narrative on file     Review of Systems  General:  No chills, fever, night sweats or weight changes.  Cardiovascular:  No chest pain, edema, orthopnea, palpitations, paroxysmal nocturnal dyspnea. + mild DOE Dermatological: No rash, lesions/masses Respiratory: No cough, dyspnea Urologic: No hematuria, dysuria Abdominal:   No nausea, vomiting, diarrhea, bright red blood per rectum, melena, or hematemesis Neurologic:  No visual changes, changes in mental status. + weakness/presyncope +occasional arthitis/gout pain All other systems reviewed and are otherwise negative except as noted above.  Physical Exam  Blood pressure 186/49, pulse 46,  temperature 97.6 F (36.4 C), temperature source Oral, resp. rate 16, SpO2 100.00%.  General: Pleasant, NAD. Lying flat in NAD Psych: Normal affect. Neuro: Alert and oriented X 3. Moves all extremities spontaneously. HEENT: Normal  Neck: Supple. +JVD carotids 2+ bilaterally with bilateral radiated murmurs. No thyromegaly or lymphadenopathy Lungs:  Resp regular and unlabored, CTA. Heart: IRR brady 2/6 AS murmur 3/6 MR  Abdomen: Soft, non-tender, non-distended, BS + x 4.  Extremities: No clubbing, cyanosis. Tr dema. DP/PT/Radials 2+ and equal bilaterally.  Labs  No results found for this basename: CKTOTAL:4,CKMB:4,TROPONINI:4 in the last 72 hours Lab Results  Component Value Date   WBC 7.8 11/21/2011   HGB 11.5* 11/21/2011   HCT 36.6 11/21/2011   MCV 88.6 11/21/2011   PLT 134* 11/21/2011    Lab 11/21/11 1703  NA 137  K 5.6*  CL 101  CO2 22  BUN 51*  CREATININE 1.74*  CALCIUM 9.0  PROT --  BILITOT --  ALKPHOS --  ALT --  AST --  GLUCOSE 86   Lab Results  Component Value Date   CHOL 126 10/25/2010   HDL 43 10/25/2010   LDLCALC 72 10/25/2010   TRIG 55 10/25/2010   No results found for this basename: DDIMER    Radiology/Studies  Dg Chest Port 1 View  11/21/2011  *RADIOLOGY REPORT*  Clinical Data: 78 year old female with shortness of breath.  PORTABLE CHEST - 1 VIEW  Comparison: 01/03/2011 chest radiograph  Findings: Cardiomegaly, cardiac surgical changes, pulmonary vascular congestion and probable interstitial pulmonary edema noted. There may be a small right pleural effusion present. There is no evidence of pneumothorax. No acute or suspicious bony abnormalities are noted.  IMPRESSION: Cardiomegaly with pulmonary vascular congestion and probable interstitial edema.  Question small right pleural effusion.  Original Report Authenticated By: Rosendo Gros, M.D.    ECG: AF with slow VR 42 with no ST-T wave abnormalities QRS 74 ms   ASSESSMENT:  1. Chronic AF with slow  ventricular response (bradycardia) 2. Weakness due in part to #1 3. Chronic diastolic HF (baseline pro-BNP ~2,956) 4. H/o MV repair with ? Residual MR murmur on exam 5. Breast CA 6. HTN 7. Chronic renal failure, stage IV  PLAN/DISCUSSION:  1. Bradycardia is likely induced by her AV-nodal blocking agents (Diltiazem and Toprol XL).  At this point will hold both of them and follow telemetry.  Will continue to follow her rate and resume as needed and will likely only need one of them.  Overall, her rates seem to be improving since presentation from 40s now in the 45s and  hemodynamically stable.  There is no current indication for pacing 2. Given that she is hypertensive, will order hydralazine PRN 3. Will order TTE given that she has a residual AS and MR murmur on exam and mild vol overload 4. Will continue her demadex and give additional dose now for mild volume overload  Thanks for consult, will continue to follow and make recommendations as needed   Signed, Kirk Ruths, MD 11/21/2011, 9:42 PM

## 2011-11-21 NOTE — Progress Notes (Signed)
ANTICOAGULATION CONSULT NOTE - Initial Consult  Pharmacy Consult for:  Coumadin Indication:  Chronic atrial fibrillation  No Known Allergies  Patient Measurements:    Vital Signs: Temp: 97.6 F (36.4 C) (07/26 1641) Temp src: Oral (07/26 1641) BP: 186/49 mmHg (07/26 2100) Pulse Rate: 46  (07/26 2100)  Labs:  Basename 11/21/11 1703  HGB 11.5*  HCT 36.6  PLT 134*  APTT --  LABPROT 23.9*  INR 2.10*  HEPARINUNFRC --  CREATININE 1.74*  CKTOTAL --  CKMB --  TROPONINI --    Medical History: Past Medical History  Diagnosis Date  . Hypertension   . GERD (gastroesophageal reflux disease)   . Glaucoma   . Cataract   . Breast cancer     right  . Valvular heart disease   . Hyperlipidemia   . Gout     Medications:  Prescriptions prior to admission  Medication Sig Dispense Refill  . Calcium Carbonate-Vitamin D ([facility]S CALCIUM 600 + D PO) Take by mouth 2 (two) times daily.        Marland Kitchen diltiazem (CARDIZEM CD) 240 MG 24 hr capsule Take 240 mg by mouth Daily.       . febuxostat (ULORIC) 40 MG tablet Take 40 mg by mouth daily.      . metoprolol (TOPROL-XL) 100 MG 24 hr tablet Take 100 mg by mouth daily.       . potassium chloride (K-DUR,KLOR-CON) 10 MEQ tablet Take 10 mEq by mouth daily.      Marland Kitchen torsemide (DEMADEX) 20 MG tablet Take 40 mg by mouth daily.       . TRAVATAN Z 0.004 % SOLN ophthalmic solution Place 1 drop into both eyes at bedtime.       Marland Kitchen warfarin (COUMADIN) 2.5 MG tablet Take 5-7.5 mg by mouth daily. 2 tablets daily except for 3 tablets on Monday, Wednesday, Friday.        Assessment:  Asked to assist with Coumadin therapy for this 78 year-old female with chronic atrial fibrillation.  The PTA dose [facility] 5 mg daily except for 7.5 mg on Monday, Wednesday, Friday.  The INR is within the therapeutic range at 2.1.  Goal of Therapy:  INR 2-3   Plan:   Coumadin 7.5 mg tonight, as [name] states that she has not taken her dose  today.  Follow PT/INR daily.  Polo Riley [facility] 11/21/2011 10:58 PM

## 2011-11-21 NOTE — ED Provider Notes (Addendum)
History     CSN: [id number]  Arrival date & time 11/21/11  1629   First MD Initiated Contact with Patient 11/21/11 1651      Chief Complaint  Patient presents with  . Shortness of Breath  . Bradycardia    (Consider location/radiation/quality/duration/timing/severity/associated sxs/prior treatment) Patient is a 78 y.o. female presenting with shortness of breath. The history is provided by the patient and a relative. The history is limited by the condition of the patient.  Shortness of Breath  Associated symptoms include shortness of breath. Pertinent negatives include no chest pain and no fever.   the patient complains of shortness of breath, and dizziness since around 11:30 this morning.  She denies pain anywhere.  She has a history of atrial fibrillation, and aortic valve replacement.  She takes Coumadin.  She denies nausea, vomiting, fevers, chills, cough, or urinary tract symptoms.  She has never felt this way before.  She denies a history of heart attack.  She does not smoke and does not drink.  Level V caveat applies for urgent need for intervention.  Because of significant bradycardia  Past Medical History  Diagnosis Date  . Hypertension   . GERD (gastroesophageal reflux disease)   . Glaucoma   . Cataract   . Breast cancer     right  . Valvular heart disease   . Hyperlipidemia     Past Surgical History  Procedure Date  . Cardiac valve replacement 2001    per medical history form dated 10/21/10  . Appendectomy 1980's    Per patient son. not sure of date.  . Tubal ligation   . Breast surgery 2012    Right br. Lumpectomy    Family History  Problem Relation Age of Onset  . Hypertension Mother   . Other Mother     Alzheimers  . Cancer Father     leukemia  . Hypertension Brother   . Other Brother     breathing problems    History  Substance Use Topics  . Smoking status: Never Smoker   . Smokeless tobacco: Never Used  . Alcohol Use: No    OB History    Grav Para Term Preterm Abortions TAB SAB Ect Mult Living                  Review of Systems  Constitutional: Negative for fever.  Respiratory: Positive for shortness of breath.   Cardiovascular: Negative for chest pain.  Gastrointestinal: Negative for nausea, vomiting and abdominal pain.  Neurological: Positive for dizziness. Negative for headaches.  Psychiatric/Behavioral: Negative for confusion.  All other systems reviewed and are negative.    Allergies  Review of patient's allergies indicates no known allergies.  Home Medications   Current Outpatient Rx  Name Route Sig Dispense Refill  . POTASSIUM CHLORIDE CRYS ER 10 MEQ PO TBCR Oral Take 10 mEq by mouth daily.    . WARFARIN SODIUM 5 MG PO TABS Oral Take 10 mg by mouth daily.    Marland Kitchen AMLODIPINE BESYLATE PO Oral Take 10 mg by mouth daily.     . [facility] CALCIUM 600 + D PO Oral Take by mouth 2 (two) times daily.      Marland Kitchen DILTIAZEM HCL ER COATED BEADS 240 MG PO CP24 Oral Take 240 mg by mouth Daily.     . FEBUXOSTAT 40 MG PO TABS Oral Take 40 mg by mouth daily.    Marland Kitchen METOPROLOL SUCCINATE ER 100 MG PO TB24 Oral Take 100 mg  by mouth daily.     Marland Kitchen OMEPRAZOLE 20 MG PO CPDR Oral Take 20 mg by mouth 2 (two) times daily.      . TORSEMIDE 20 MG PO TABS Oral Take 40 mg by mouth daily.     . TRAVATAN Z 0.004 % OP SOLN Both Eyes Place 1 drop into both eyes at bedtime.       BP 136/53  Pulse 39  Temp 97.6 F (36.4 C) (Oral)  Resp 16  SpO2 98%  Physical Exam  Nursing note and vitals reviewed. Constitutional: She is oriented to person, place, and time. She appears well-developed and well-nourished. No distress.  HENT:  Head: Normocephalic and atraumatic.  Neck: Normal range of motion. Neck supple. JVD present.       No carotid bruits  Cardiovascular:  Murmur heard.      Bradycardia with irregular heartbeat  Pulmonary/Chest: Effort normal. She has no wheezes. She has no rales.  Abdominal: Soft. She exhibits no distension. There is no  tenderness.  Musculoskeletal: Normal range of motion.  Neurological: She is alert and oriented to person, place, and time.  Skin: Skin is warm and dry.  Psychiatric: She has a normal mood and affect. Thought content normal.    ED Course  Procedures (including critical care time) 78 year old, female, with profound bradycardia, and atrial fibrillation.  While lying in bed.  She is asymptomatic.  We'll place pacer pads and check laboratory testing, and chest x-ray, for evaluation and then consult her cardiologist  Labs Reviewed  CBC WITH DIFFERENTIAL  BASIC METABOLIC PANEL  PROTIME-INR  URINALYSIS, ROUTINE W REFLEX MICROSCOPIC   No results found.   No diagnosis found.  ECG. Atrial fibrillation at 42 beats per minute Normal axis. Normal intervals. Nonspecific T-wave changes. Impression atrial fibrillation, with slow ventricular response, rate, decreased compared to prior EKG from 01/03/2011  6:39 PM Spoke with Dr. Mayford Knife.  She rec'd hospitalist admission and cards consult  7:06 PM Spoke with Dr. Kerry Hough.  He will admit for iatrogenic bradycardia  CRITICAL CARE Performed by: Asiel Chrostowski P   Total critical care time: 30 min. Critical care time was exclusive of separately billable procedures and treating other patients.  Critical care was necessary to treat or prevent imminent or life-threatening deterioration.  Critical care was time spent personally by me on the following activities: development of treatment plan with patient and/or surrogate as well as nursing, discussions with consultants, evaluation of patient's response to treatment, examination of patient, obtaining history from patient or surrogate, ordering and performing treatments and interventions, ordering and review of laboratory studies, ordering and review of radiographic studies, pulse oximetry and re-evaluation of patient's condition.   Urgent eval and pacer pads applied.   MDM  Atrial fibrillation, with  profound bradycardia, causing, shortness of breath, and dizzy. Iatrogenic bradycardia  Renal insufficiency hyperkalemia        Cheri Guppy, MD 11/21/11 1840  Cheri Guppy, MD 11/21/11 1907

## 2011-11-21 NOTE — ED Notes (Signed)
MD at bedside. 

## 2011-11-22 ENCOUNTER — Encounter (HOSPITAL_COMMUNITY): Payer: Self-pay

## 2011-11-22 LAB — CARDIAC PANEL(CRET KIN+CKTOT+MB+TROPI)
CK, MB: 3.5 ng/mL (ref 0.3–4.0)
CK, MB: 3.2 ng/mL (ref 0.3–4.0)
Relative Index: 3.5 — ABNORMAL HIGH (ref 0.0–2.5)
Relative Index: INVALID (ref 0.0–2.5)
Total CK: 95 U/L (ref 7–177)
Total CK: 93 U/L (ref 7–177)
Troponin I: <0.3 ng/mL (ref <0.30)
Troponin I: <0.3 ng/mL (ref <0.30)

## 2011-11-22 LAB — CBC
MCH: 27.6 pg (ref 26.0–34.0)
MCHC: 31.2 g/dL (ref 30.0–36.0)
MCV: 88.5 fL (ref 78.0–100.0)
Platelets: 114 10*3/uL — ABNORMAL LOW (ref 150–400)

## 2011-11-22 LAB — COMPREHENSIVE METABOLIC PANEL
ALT: 14 U/L (ref 0–35)
AST: 21 U/L (ref 0–37)
Calcium: 8.5 mg/dL (ref 8.4–10.5)
Creatinine, Ser: 1.51 mg/dL — ABNORMAL HIGH (ref 0.50–1.10)
GFR calc Af Amer: 38 mL/min — ABNORMAL LOW (ref >90)
Sodium: 143 mEq/L (ref 135–145)
Total Protein: 6.9 g/dL (ref 6.0–8.3)

## 2011-11-22 LAB — PROTIME-INR: INR: 2.51 — ABNORMAL HIGH (ref 0.00–1.49)

## 2011-11-22 MED ORDER — DILTIAZEM HCL ER COATED BEADS 120 MG PO CP24
120.0000 mg | ORAL_CAPSULE | Freq: Every day | ORAL | Status: DC
  Administered 2011-11-22: 120 mg via ORAL
  Filled 2011-11-22 (×2): qty 1

## 2011-11-22 MED ORDER — TORSEMIDE 20 MG PO TABS
40.0000 mg | ORAL_TABLET | Freq: Every day | ORAL | Status: DC
  Administered 2011-11-22: 40 mg via ORAL
  Filled 2011-11-22 (×2): qty 2

## 2011-11-22 MED ORDER — WARFARIN SODIUM 5 MG PO TABS
5.0000 mg | ORAL_TABLET | Freq: Once | ORAL | Status: AC
  Administered 2011-11-22: 5 mg via ORAL
  Filled 2011-11-22: qty 1

## 2011-11-22 NOTE — Progress Notes (Signed)
SUBJECTIVE:  Feels better  OBJECTIVE:   Vitals:   Filed Vitals:   11/22/11 0400 11/22/11 0500 11/22/11 0600 11/22/11 0800  BP: 158/61 145/59 147/85 109/90  Pulse: 67 65 70 80  Temp: 98 F (36.7 C)   98.3 F (36.8 C)  TempSrc: Oral   Oral  Resp: 14 14 15 19   Height:      Weight: 79.5 kg (175 lb 4.3 oz)     SpO2: 100% 100% 100% 100%   I&O's:   Intake/Output Summary (Last 24 hours) at 11/22/11 0911 Last data filed at 11/22/11 0800  Gross per 24 hour  Intake    360 ml  Output   1950 ml  Net  -1590 ml   TELEMETRY: Reviewed telemetry pt in AFib, rate controlled:     PHYSICAL EXAM General: Well developed, well nourished, in no acute distress Head: Eyes PERRLA, No xanthomas.   Normal cephalic and atramatic  Lungs: Clear bilaterally to auscultation and percussion. Heart:  Irregularly irregular Abdomen:  abdomen soft and non-tender Msk:   Normal strength and tone for age. Extremities:   Venous stasis, DP 2+ bilaterally Neuro: Alert and oriented X 3. Psych:  Normal affect, responds appropriately   LABS: Basic Metabolic Panel:  Basename 11/22/11 0608 11/21/11 1703  NA 143 137  K 3.0* 5.6*  CL 107 101  CO2 24 22  GLUCOSE 85 86  BUN 47* 51*  CREATININE 1.51* 1.74*  CALCIUM 8.5 9.0  MG -- --  PHOS -- --   Liver Function Tests:  [facility] 11/22/11 0608  AST 21  ALT 14  ALKPHOS 127*  BILITOT 0.5  PROT 6.9  ALBUMIN 3.1*   No results found for this basename: LIPASE:2,AMYLASE:2 in the last 72 hours CBC:  Basename 11/22/11 0608 11/21/11 1703  WBC 6.8 7.8  NEUTROABS -- 6.4  HGB 9.6* 11.5*  HCT 30.8* 36.6  MCV 88.5 88.6  PLT 114* 134*   Cardiac Enzymes:  Basename 11/22/11 0608 11/21/11 2315  CKTOTAL 95 125  CKMB 3.5 4.4*  CKMBINDEX -- --  TROPONINI <0.30 <0.30   BNP: No components found with this basename: POCBNP:3 D-Dimer: No results found for this basename: DDIMER:2 in the last 72 hours Hemoglobin A1C: No results found for this basename: HGBA1C in  the last 72 hours Fasting Lipid Panel: No results found for this basename: CHOL,HDL,LDLCALC,TRIG,CHOLHDL,LDLDIRECT in the last 72 hours Thyroid Function Tests:  Basename 11/21/11 2315  TSH 1.498  T4TOTAL --  T3FREE --  THYROIDAB --   Anemia Panel: No results found for this basename: VITAMINB12,FOLATE,FERRITIN,TIBC,IRON,RETICCTPCT in the last 72 hours Coag Panel:   Lab Results  Component Value Date   INR 2.51* 11/22/2011   INR 2.10* 11/21/2011   INR 3.68* 01/07/2011   PROTIME 27.6* 12/09/2010    RADIOLOGY: Dg Chest Port 1 View  11/21/2011  *RADIOLOGY REPORT*  Clinical Data: 78 year old female with shortness of breath.  PORTABLE CHEST - 1 VIEW  Comparison: 01/03/2011 chest radiograph  Findings: Cardiomegaly, cardiac surgical changes, pulmonary vascular congestion and probable interstitial pulmonary edema noted. There may be a small right pleural effusion present. There is no evidence of pneumothorax. No acute or suspicious bony abnormalities are noted.  IMPRESSION: Cardiomegaly with pulmonary vascular congestion and probable interstitial edema.  Question small right pleural effusion.  Original Report Authenticated By: Rosendo Gros, M.D.      ASSESSMENT: Bradycardia, AFib, valve replacement  PLAN:  Bradycardia improved.  She is off of the Toprol and diltiazem.  Blood pressure  has been somewhat high.  We'll restart diltiazem 120 mg daily.  This will help with preventing tachycardia and will help lower blood pressure.  Atrial fibrillation, currently rate controlled.  Echocardiogram pending.  Evidence of fluid overload on x-ray.  We'll restart home dose of Demadex.  Discussed with Dr. Deboraha Sprang., MD  11/22/2011  9:11 AM

## 2011-11-22 NOTE — Progress Notes (Signed)
*  PRELIMINARY RESULTS* Echocardiogram 2D Echocardiogram has been performed.  [name] 11/22/2011, 9:20 AM

## 2011-11-22 NOTE — Progress Notes (Signed)
Patient transferring to room 1431.  Report called to Darl Pikes, RN.  Patient to travel by wheelchair.  Will continue to monitor.

## 2011-11-22 NOTE — Progress Notes (Signed)
PT NOTE: Went to assess pt this morning and was receiving bath.  Then returned and pt was being transferred up to the unit/floor.  Will assess on the floor as time permits, and or see pt tomorrow.   Marella Bile, PT Pager: [phone] 11/22/2011

## 2011-11-22 NOTE — Progress Notes (Signed)
ANTICOAGULATION CONSULT NOTE - Follow Up Consult  Pharmacy Consult for Coumadin Indication: atrial fibrillation  No Known Allergies  Patient Measurements: Height: 5\' 2"  (157.5 cm) Weight: 175 lb 4.3 oz (79.5 kg) IBW/kg (Calculated) : 50.1   Vital Signs: Temp: 98.3 F (36.8 C) (07/27 0800) Temp src: Oral (07/27 0800) BP: 109/90 mmHg (07/27 0800) Pulse Rate: 80  (07/27 0800)  Labs:  Alvira Philips 11/22/11 0608 11/21/11 2315 11/21/11 1703  HGB 9.6* -- 11.5*  HCT 30.8* -- 36.6  PLT 114* -- 134*  APTT -- -- --  LABPROT 27.5* -- 23.9*  INR 2.51* -- 2.10*  HEPARINUNFRC -- -- --  CREATININE 1.51* -- 1.74*  CKTOTAL 95 125 --  CKMB 3.5 4.4* --  TROPONINI <0.30 <0.30 --    Estimated Creatinine Clearance: 31.5 ml/min (by C-G formula based on Cr of 1.51).   Assessment:  77 YOF on chronic warfarin PTA for atrial fibrillation.   Home dose: 5 mg daily except 7.5 mg on MWF  INR therapeutic on admission, remains therapeutic today.  Hgb down from yesterday.  No bleeding reported.  Monitoring.  Goal of Therapy:  INR 2-3   Plan:  Warfarin 5 mg PO once today. Follow up INR in AM.   Mattie Marlin, Danissa Rundle 11/22/2011,9:25 AM

## 2011-11-22 NOTE — Progress Notes (Addendum)
PROGRESS NOTE  [name] ZOX:[id number] DOB: 1933/08/18 DOA: 11/21/2011 PCP: Henri Medal, MD  Brief narrative: 78 yr old female with h/o Afibadmit 7/26 with Bradycardia/malaise  Past medical history-As per Problem list Chart reviewed as below- R partial mastectomy, triple neg 2cm mastectomy adenoca T1C/N0 last Echo 01/02/11 admit= EF 60-65%-normal systolic function Afib, on coumadin CKD3 Htn MVP c repair in 2001 Chronic anemia H/o gout   Consultants:  Cardiology-Dr. Eldridge Dace  Procedures:  CXR 11/21/11 Cardiomegally c Pulm vasc congestions + Mild interstitial edema, ? Pleural effusion  Echo Pending  Antibiotics:  Nil   Subjective  Very hungry.  Was n.p.o. overnight. No Nausea vomiting or shortness of breath no chest pain no abdominal pain States she has chronic wounds on her left lower extremity-has been treated by orthopedics States she came in because of dizziness and near fall while she was standing at the sink washing dishes yesterday afternoon.  Has never happened before Medications were not adjusted by Dr. Katrinka Blazing at last office visit about 2 weeks ago    Objective    Interim History: Database reviewed   Objective: Filed Vitals:   11/22/11 0300 11/22/11 0400 11/22/11 0500 11/22/11 0600  BP: 125/35 158/61 145/59 147/85  Pulse: 59 67 65 70  Temp:  98 F (36.7 C)    TempSrc:  Oral    Resp: 14 14 14 15   Height:      Weight:  79.5 kg (175 lb 4.3 oz)    SpO2: 100% 100% 100% 100%    Intake/Output Summary (Last 24 hours) at 11/22/11 0735 Last data filed at 11/22/11 0600  Gross per 24 hour  Intake    240 ml  Output   1950 ml  Net  -1710 ml    Exam:  General: A pleasant alert African American female, arcus senilis, no icterus, no pallor, JVD noted, no carotid bruit, poor dentition Cardiovascular: S1-S2 murmur in left and right upper sternal edge, grade 2 on 6 crit to grade 3 on 6, irregularly irregular rhythm on telemetry with heart rates  between 101 20 Respiratory: Clinically clear no tactile vocal resonance fremitus Abdomen: Soft nontender nondistended Skin dusky discoloration of both lower extremities with 3 cm round ulcer medial malleolus and similar ulcer on lateral aspect of left leg.  Pedal pulses felt in dorsalis pedis Neuro motor grossly normal, temperature is midline, uvula intact, no slurring of speech,  Data Reviewed: Basic Metabolic Panel:  Lab 11/22/11 8119 11/21/11 1703  NA 143 137  K 3.0* 5.6*  CL 107 101  CO2 24 22  GLUCOSE 85 86  BUN 47* 51*  CREATININE 1.51* 1.74*  CALCIUM 8.5 9.0  MG -- --  PHOS -- --   Liver Function Tests:  Lab 11/22/11 0608  AST 21  ALT 14  ALKPHOS 127*  BILITOT 0.5  PROT 6.9  ALBUMIN 3.1*   No results found for this basename: LIPASE:5,AMYLASE:5 in the last 168 hours No results found for this basename: AMMONIA:5 in the last 168 hours CBC:  Lab 11/22/11 0608 11/21/11 1703  WBC 6.8 7.8  NEUTROABS -- 6.4  HGB 9.6* 11.5*  HCT 30.8* 36.6  MCV 88.5 88.6  PLT 114* 134*   Cardiac Enzymes:  Lab 11/22/11 0608 11/21/11 2315  CKTOTAL 95 125  CKMB 3.5 4.4*  CKMBINDEX -- --  TROPONINI <0.30 <0.30   BNP: No components found with this basename: POCBNP:5 CBG: No results found for this basename: GLUCAP:5 in the last 168 hours  Recent Results (  from the past 240 hour(s))  MRSA PCR SCREENING     Status: Normal   Collection Time   11/21/11 10:05 PM      Component Value Range Status Comment   MRSA by PCR NEGATIVE  NEGATIVE Final      Studies:              All Imaging reviewed and is as per above notation   Scheduled Meds:   . aspirin EC  81 mg Oral Daily  . sodium polystyrene  60 g Oral Once  . torsemide  40 mg Oral Daily  . Travoprost (BAK Free)  1 drop Both Eyes QHS  . warfarin  7.5 mg Oral Once  . Warfarin - Pharmacist Dosing Inpatient   Does not apply q1800  . DISCONTD: warfarin  10-15 mg Oral QPM  . DISCONTD: warfarin  5 mg Oral Once   Continuous  Infusions:   . DISCONTD: sodium chloride 125 mL/hr at 11/21/11 1701     Assessment/Plan: 1. Bradycardia in the setting of rate controlled A. fib-have discussed with cardiologist Dr. Zadie Rhine reimplement Cardizem in half the normal by mouth dose of 240 = 120 mg daily.  hold metoprolol for now.  Cardiac enzymes x2 are negative-echocardiogram is pending.  Await echo and review heart rate, potentially can go home in one to 2 days 2. Chronic atrial fibrillation [place] Score= 2, on Coumadin-therapeutic at 2.51.  Pharmacy to dose. 3. Chronic diastolic heart failure-patient in no overt failure, would continue diuretics judiciously on an outpatient basis.  They have been held in hospital. 4. Acute on chronic kidney disease= resolved.  Vision had some hyperkalemia overnight and was given one dose of Kayexalate as well.  Repeat potassium 3.0 (unsure of this was not correct lab) 5. ?  Elevated alkaline phosphatase-this is been elevated since 12/2010.  Could potentially be secondary to nonalcoholic steatohepatitis vs. other etiologies-she does have a history of breast cancer. 6. R partial mastectomy, triple neg 2cm mastectomy adenoca T1C/N0-patient follow up with Dr. Darnelle Catalan as an outpatient 7. Venous stasis ulcers-claims to seen an orthopedic, does not remember the name.  Will order her to get dressings and will need further followup as an outpatient. 8. Anemia-likely of chronic disease. 9. Gout-Continue Uloric.  asymptomatic  Code Status: Full Family Communication: None at bedside-will update with plan of care probably in the morning Disposition Plan: Pending echocardiogram and response to rate controlling agent-potentially discharged tomorrow--we will transfer to telemetry   Pleas Koch, MD  [facility] Pager [phone] 11/22/2011, 7:35 AM    LOS: 1 day

## 2011-11-23 ENCOUNTER — Encounter (HOSPITAL_COMMUNITY): Payer: Self-pay | Admitting: *Deleted

## 2011-11-23 LAB — COMPREHENSIVE METABOLIC PANEL
ALT: 12 U/L (ref 0–35)
CO2: 28 mEq/L (ref 19–32)
Calcium: 8.1 mg/dL — ABNORMAL LOW (ref 8.4–10.5)
Creatinine, Ser: 1.37 mg/dL — ABNORMAL HIGH (ref 0.50–1.10)
GFR calc Af Amer: 43 mL/min — ABNORMAL LOW (ref >90)
GFR calc non Af Amer: 37 mL/min — ABNORMAL LOW (ref >90)
Glucose, Bld: 78 mg/dL (ref 70–99)
Sodium: 139 mEq/L (ref 135–145)
Total Protein: 6.3 g/dL (ref 6.0–8.3)

## 2011-11-23 LAB — CBC
HCT: 30.3 % — ABNORMAL LOW (ref 36.0–46.0)
Hemoglobin: 9.5 g/dL — ABNORMAL LOW (ref 12.0–15.0)
MCH: 27.3 pg (ref 26.0–34.0)
MCHC: 31.4 g/dL (ref 30.0–36.0)
MCV: 87.1 fL (ref 78.0–100.0)
Platelets: 125 10*3/uL — ABNORMAL LOW (ref 150–400)
RBC: 3.48 MIL/uL — ABNORMAL LOW (ref 3.87–5.11)
RDW: 18 % — ABNORMAL HIGH (ref 11.5–15.5)
WBC: 7.7 10*3/uL (ref 4.0–10.5)

## 2011-11-23 LAB — PROTIME-INR
INR: 2.96 — ABNORMAL HIGH (ref 0.00–1.49)
Prothrombin Time: 31.3 seconds — ABNORMAL HIGH (ref 11.6–15.2)

## 2011-11-23 MED ORDER — FUROSEMIDE 10 MG/ML IJ SOLN
80.0000 mg | Freq: Once | INTRAMUSCULAR | Status: AC
  Administered 2011-11-23: 80 mg via INTRAVENOUS
  Filled 2011-11-23: qty 8

## 2011-11-23 MED ORDER — POTASSIUM CHLORIDE CRYS ER 20 MEQ PO TBCR: 40.0000 meq | EXTENDED_RELEASE_TABLET | Freq: Two times a day (BID) | ORAL | Status: DC

## 2011-11-23 MED ORDER — DILTIAZEM HCL ER COATED BEADS 240 MG PO CP24
240.0000 mg | ORAL_CAPSULE | Freq: Every day | ORAL | Status: DC
  Administered 2011-11-23: 240 mg via ORAL
  Filled 2011-11-23: qty 1

## 2011-11-23 MED ORDER — POTASSIUM CHLORIDE CRYS ER 20 MEQ PO TBCR
40.0000 meq | EXTENDED_RELEASE_TABLET | Freq: Two times a day (BID) | ORAL | Status: DC
  Administered 2011-11-23: 40 meq via ORAL
  Filled 2011-11-23 (×2): qty 2

## 2011-11-23 MED ORDER — POTASSIUM CHLORIDE CRYS ER 20 MEQ PO TBCR
40.0000 meq | EXTENDED_RELEASE_TABLET | Freq: Two times a day (BID) | ORAL | Status: DC
  Filled 2011-11-23 (×2): qty 2

## 2011-11-23 NOTE — Progress Notes (Signed)
SUBJECTIVE:  Feels better  OBJECTIVE:   Vitals:   Filed Vitals:   11/22/11 1352 11/22/11 2226 11/22/11 2300 11/23/11 0545  BP: 166/80 176/79 120/63 133/80  Pulse: 62 78  88  Temp: 98.1 F (36.7 C) 98.1 F (36.7 C)  98 F (36.7 C)  TempSrc: Oral Oral  Oral  Resp: 18 18  18   Height:      Weight:    72.666 kg (160 lb 3.2 oz)  SpO2: 96% 94%  91%   I&O's:    Intake/Output Summary (Last 24 hours) at 11/23/11 0905 Last data filed at 11/23/11 0545  Gross per 24 hour  Intake    480 ml  Output   2800 ml  Net  -2320 ml   TELEMETRY: Reviewed telemetry pt in AFib, rate controlled with some RVR:     PHYSICAL EXAM General: Well developed, well nourished, in no acute distress Head: Eyes PERRLA, No xanthomas.   Normal cephalic and atraumatic  Lungs: Clear bilaterally to auscultation and percussion. Heart:  Irregularly irregular Abdomen:  abdomen soft and non-tender Msk:   Normal strength and tone for age. Extremities:   Venous stasis, DP 2+ bilaterally Neuro: Alert and oriented X 3. Psych:  Normal affect, responds appropriately   LABS: Basic Metabolic Panel:  Basename 11/23/11 0441 11/22/11 0608  NA 139 143  K 3.0* 3.0*  CL 102 107  CO2 28 24  GLUCOSE 78 85  BUN 39* 47*  CREATININE 1.37* 1.51*  CALCIUM 8.1* 8.5  MG -- --  PHOS -- --   Liver Function Tests:  Basename 11/23/11 0441 11/22/11 0608  AST 20 21  ALT 12 14  ALKPHOS 114 127*  BILITOT 0.6 0.5  PROT 6.3 6.9  ALBUMIN 2.7* 3.1*   No results found for this basename: LIPASE:2,AMYLASE:2 in the last 72 hours CBC:  Basename 11/23/11 0441 11/22/11 0608 11/21/11 1703  WBC 7.7 6.8 --  NEUTROABS -- -- 6.4  HGB 9.5* 9.6* --  HCT 30.3* 30.8* --  MCV 87.1 88.5 --  PLT 125* 114* --   Cardiac Enzymes:  Basename 11/22/11 1401 11/22/11 0608 11/21/11 2315  CKTOTAL 93 95 125  CKMB 3.2 3.5 4.4*  CKMBINDEX -- -- --  TROPONINI <0.30 <0.30 <0.30   BNP: No components found with this basename:  POCBNP:3 D-Dimer: No results found for this basename: DDIMER:2 in the last 72 hours Hemoglobin A1C: No results found for this basename: HGBA1C in the last 72 hours Fasting Lipid Panel: No results found for this basename: CHOL,HDL,LDLCALC,TRIG,CHOLHDL,LDLDIRECT in the last 72 hours Thyroid Function Tests:  Basename 11/21/11 2315  TSH 1.498  T4TOTAL --  T3FREE --  THYROIDAB --   Anemia Panel: No results found for this basename: VITAMINB12,FOLATE,FERRITIN,TIBC,IRON,RETICCTPCT in the last 72 hours Coag Panel:   Lab Results  Component Value Date   INR 2.96* 11/23/2011   INR 2.51* 11/22/2011   INR 2.10* 11/21/2011   PROTIME 27.6* 12/09/2010    RADIOLOGY: Dg Chest Port 1 View  11/21/2011  *RADIOLOGY REPORT*  Clinical Data: 78 year old female with shortness of breath.  PORTABLE CHEST - 1 VIEW  Comparison: 01/03/2011 chest radiograph  Findings: Cardiomegaly, cardiac surgical changes, pulmonary vascular congestion and probable interstitial pulmonary edema noted. There may be a small right pleural effusion present. There is no evidence of pneumothorax. No acute or suspicious bony abnormalities are noted.  IMPRESSION: Cardiomegaly with pulmonary vascular congestion and probable interstitial edema.  Question small right pleural effusion.  Original Report Authenticated By: JEFFREY T.  HU, M.D.      ASSESSMENT: Bradycardia, AFib, valve replacement  PLAN:  Bradycardia improved.  She is off of the Toprol.  Blood pressure has been somewhat high.  We'll increase diltiazem to 240 mg daily.  This will help with preventing tachycardia and will help lower blood pressure.  Continue to hold beta blocker.  Atrial fibrillation, fair rate control.  Echocardiogram showed increased CVP, moderate AS, mitral regurgitation.  Evidence of fluid overload on x-ray.  We'll give a dose of IV lasix.  Replace potassium.  Discussed with Dr. Mahala Menghini.  If HR stable later today after she receives the increased dose of Lasix,  could consider D/C.   Corky Crafts., MD  11/23/2011  9:05 AM

## 2011-11-23 NOTE — Evaluation (Signed)
Physical Therapy One Time Evaluation and D/C from acute PT Patient Details Name: [name] MRN: [id number] DOB: 1933-05-10 Today's Date: 11/23/2011 Time: 4098-1191 PT Time Calculation (min): 13 min  PT Assessment / Plan / Recommendation Clinical Impression  Pt admitted for bradycardia and afib.  Pt doing very well with mobility and reports she is at baseline.  Pt reports her son and grandson are home usually most of the time if she needs any assist.  Pt has all DME.  PT to sign off.    PT Assessment  Patent does not need any further PT services    Follow Up Recommendations  No PT follow up    Barriers to Discharge        Equipment Recommendations  None recommended by PT    Recommendations for Other Services     Frequency      Precautions / Restrictions Precautions Precautions: None   Pertinent Vitals/Pain SaO2 96% at rest SaO2 88-95% during ambulation on room air, HR did increase to 128 bpm during ambulation but back to 87 bpm at rest.  Pt reports no SOB with activity.      Mobility  Transfers Transfers: Sit to Stand;Stand to Sit Sit to Stand: 6: Modified independent (Device/Increase time) Stand to Sit: 6: Modified independent (Device/Increase time) Ambulation/Gait Ambulation/Gait Assistance: 6: Modified independent (Device/Increase time) Ambulation Distance (Feet): 150 Feet Assistive device: Rolling walker Ambulation/Gait Assistance Details: increased toe out and pes planus L foot, SaO2 88% or above during ambulation Gait Pattern: Step-through pattern    Exercises     PT Diagnosis:    PT Problem List:   PT Treatment Interventions:     PT Goals    Visit Information  Last PT Received On: 11/23/11 Assistance Needed: +1    Subjective Data  Subjective: I'm doing well.   Prior Functioning  Home Living Lives With: Son Type of Home: House Home Access: Ramped entrance Home Layout: One level Home Adaptive Equipment: Bedside commode/3-in-1;Walker -  rolling;Tub transfer bench Prior Function Level of Independence: Independent with assistive device(s) Communication Communication: No difficulties    Cognition  Overall Cognitive Status: Appears within functional limits for tasks assessed/performed Arousal/Alertness: Awake/alert Orientation Level: Appears intact for tasks assessed Behavior During Session: [facility] for tasks performed    Extremity/Trunk Assessment Right Upper Extremity Assessment RUE ROM/Strength/Tone: [facility] for tasks assessed Left Upper Extremity Assessment LUE ROM/Strength/Tone: [facility] for tasks assessed Right Lower Extremity Assessment RLE ROM/Strength/Tone: [facility] for tasks assessed Left Lower Extremity Assessment LLE ROM/Strength/Tone: Deficits LLE ROM/Strength/Tone Deficits: increased inversion of forefoot and pes planus at rest and with ambulation otherwise [facility] for tasks assessed   Balance    End of Session PT - End of Session Activity Tolerance: Patient tolerated treatment well Patient left: in chair;with call bell/phone within reach  GP     [name],[name] 11/23/2011, 9:30 AM Pager: [phone]

## 2011-11-23 NOTE — Discharge Summary (Signed)
Physician Discharge Summary  [name] MWN:[id number] DOB: May 12, 1933 DOA: 11/21/2011  PCP: Henri Medal, MD  Admit date: 11/21/2011 Discharge date: 11/23/2011  Recommendations for Outpatient Follow-up:  1. f/u cardiologist Dr. Katrinka Blazing 1 mo 2. Follow up with Dr. Darnelle Catalan as an out-patient for Breast cancer in 1-2 weeks 3. Needs Bmet/INR in 1 week  Discharge Diagnoses:  Principal Problem:  *Bradycardia Active Problems:  Breast cancer  Chronic diastolic heart failure  Atrial fibrillation  Gout  Chronic anticoagulation  Acute renal failure  Chronic kidney disease, stage 3, mod decreased GFR  Hypertension  H/O mitral valve repair   Discharge Condition: Good  Diet recommendation: Low salt heart healthy  Wt Readings from Last 3 Encounters:  11/23/11 72.666 kg (160 lb 3.2 oz)  09/18/11 72.213 kg (159 lb 3.2 oz)  06/11/11 69.446 kg (153 lb 1.6 oz)    History of present illness:  This  pleasant 78 year old female presented with weakness, shortness of breath.  She has multiple cardiac concerns and patient lives at home. She denies any orthopnea, peripheral edema and was brought to the emergency room where he was noted that she her heart rate ranging in the 30s to 40s. SHe had seen her 1ry cardiologist and he had recently made some changes to her medications.     Hospital Course:  1. Bradycardia- iatrogenic from beta blockers/calcium channel blockers.   Adjustments made 2 medications by cardiologist and echocardiogram report done 2. Chronic atrial fibrillation.  Patient to go home her cardiologist on Cardizem 240 mg daily.  Discontinue metoprolol.  INR on discharge 2.96. 3. Chronic diastolic congestive heart failure-diuretics initially held and then restarted in the hospital 4. Acute on chronic kidney disease. This may just be progression of her chronic kidney disease. Although her BUN/creatinine ratio indicates a prerenal cause. She does not appear to be overtly volume  overloaded.  5. Breast cancer. Patient has a history of breast cancer in the past with lumpectomy and radiation treatments. Per record she is still in remission. 6. Pulmonary hypertension , probably secondary to atrial fibrillation, valve regurgitation-likely needs outpatient followup for the same 7. Hypertension-continue Cardizem 240 mg 8. Lower extremity venous ulceration-followup with primary care physician 9. Anemia probably secondary to hemodilution from IV fluids.  Consultants:  Cardiology-Dr. Eldridge Dace  Procedures:  CXR 11/21/11 Cardiomegally c Pulm vasc congestions + Mild interstitial edema, ? Pleural effusion  Echo 7/28 2013 = EF 65-70%, noted aortic valve stenosis, mild mitral valve regurgitation, PA peak pressure 59 mmHg  Discharge Exam: Filed Vitals:   11/23/11 0545  BP: 133/80  Pulse: 88  Temp: 98 F (36.7 C)  Resp: 18   Filed Vitals:   11/22/11 1352 11/22/11 2226 11/22/11 2300 11/23/11 0545  BP: 166/80 176/79 120/63 133/80  Pulse: 62 78  88  Temp: 98.1 F (36.7 C) 98.1 F (36.7 C)  98 F (36.7 C)  TempSrc: Oral Oral  Oral  Resp: 18 18  18   Height:      Weight:    72.666 kg (160 lb 3.2 oz)  SpO2: 96% 94%  91%    General: A pleasant alert African American female, arcus senilis, no icterus, no pallor, JVD noted, no carotid bruit, poor dentition  Cardiovascular: S1-S2 murmur in left and right upper sternal edge, grade 2 on 6  to grade 3 on 6, irregularly irregular rhythm Respiratory: Clinically clear no tactile vocal resonance fremitus  Abdomen: Soft nontender nondistended  Skin dusky discoloration of both lower extremities with 3 cm round  ulcer medial malleolus and similar ulcer on lateral aspect of left leg. Pedal pulses felt in dorsalis pedis  Neuro motor grossly normal, temperature is midline, uvula intact, no slurring of speech,   Discharge Instructions  Discharge Orders    Future Appointments: Provider: Department: Dept Phone: Center:   12/09/2011 2:30  PM Sherrie Mustache [facility] [phone] None   12/09/2011 3:00 PM Lowella Dell, MD [facility] [phone] None     Medication List  As of 11/23/2011  3:24 PM   STOP taking these medications         metoprolol succinate 100 MG 24 hr tablet         TAKE these medications         [employer] CALCIUM 600 + D PO   Take by mouth 2 (two) times daily.      diltiazem 240 MG 24 hr capsule   Commonly known as: CARDIZEM CD   Take 240 mg by mouth Daily.      febuxostat 40 MG tablet   Commonly known as: ULORIC   Take 40 mg by mouth daily.      potassium chloride SA 20 MEQ tablet   Commonly known as: K-DUR,KLOR-CON   Take 2 tablets (40 mEq total) by mouth 2 (two) times daily.      torsemide 20 MG tablet   Commonly known as: DEMADEX   Take 40 mg by mouth daily.      TRAVATAN Z 0.004 % Soln ophthalmic solution   Generic drug: Travoprost (BAK Free)   Place 1 drop into both eyes at bedtime.      warfarin 2.5 MG tablet   Commonly known as: COUMADIN   Take 5-7.5 mg by mouth daily. 2 tablets daily except for 3 tablets on Monday, Wednesday, Friday.             The results of significant diagnostics from this hospitalization (including imaging, microbiology, ancillary and laboratory) are listed below for reference.    Significant Diagnostic Studies: Dg Chest Port 1 View  11/21/2011  *RADIOLOGY REPORT*  Clinical Data: 78 year old female with shortness of breath.  PORTABLE CHEST - 1 VIEW  Comparison: 01/03/2011 chest radiograph  Findings: Cardiomegaly, cardiac surgical changes, pulmonary vascular congestion and probable interstitial pulmonary edema noted. There may be a small right pleural effusion present. There is no evidence of pneumothorax. No acute or suspicious bony abnormalities are noted.  IMPRESSION: Cardiomegaly with pulmonary vascular congestion and probable interstitial edema.  Question small right pleural effusion.  Original Report Authenticated By: Rosendo Gros, M.D.     Microbiology: Recent Results (from the past 240 hour(s))  MRSA PCR SCREENING     Status: Normal   Collection Time   11/21/11 10:05 PM      Component Value Range Status Comment   MRSA by PCR NEGATIVE  NEGATIVE Final      Labs: Basic Metabolic Panel:  Lab 11/23/11 9629 11/22/11 0608 11/21/11 1703  NA 139 143 137  K 3.0* 3.0* 5.6*  CL 102 107 101  CO2 28 24 22   GLUCOSE 78 85 86  BUN 39* 47* 51*  CREATININE 1.37* 1.51* 1.74*  CALCIUM 8.1* 8.5 9.0  MG -- -- --  PHOS -- -- --   Liver Function Tests:  Lab 11/23/11 0441 11/22/11 0608  AST 20 21  ALT 12 14  ALKPHOS 114 127*  BILITOT 0.6 0.5  PROT 6.3 6.9  ALBUMIN 2.7* 3.1*   No results found for this basename:  LIPASE:5,AMYLASE:5 in the last 168 hours No results found for this basename: AMMONIA:5 in the last 168 hours CBC:  Lab 11/23/11 0441 11/22/11 0608 11/21/11 1703  WBC 7.7 6.8 7.8  NEUTROABS -- -- 6.4  HGB 9.5* 9.6* 11.5*  HCT 30.3* 30.8* 36.6  MCV 87.1 88.5 88.6  PLT 125* 114* 134*   Cardiac Enzymes:  Lab 11/22/11 1401 11/22/11 0608 11/21/11 2315  CKTOTAL 93 95 125  CKMB 3.2 3.5 4.4*  CKMBINDEX -- -- --  TROPONINI <0.30 <0.30 <0.30   BNP: BNP (last 3 results)  Basename 11/21/11 2315 01/05/11 0420 01/03/11 0144  PROBNP 7408.0* 6465.0* 8316.0*   CBG: No results found for this basename: GLUCAP:5 in the last 168 hours  Time coordinating discharge: 40 minutes  Signed:  Rhetta Mura  [facility] 11/23/2011, 8:50 AM

## 2011-11-23 NOTE — Progress Notes (Signed)
ANTICOAGULATION CONSULT NOTE - Follow Up Consult  Pharmacy Consult for Coumadin Indication: atrial fibrillation  No Known Allergies  Patient Measurements: Height: 5\' 2"  (157.5 cm) Weight: 160 lb 3.2 oz (72.666 kg) IBW/kg (Calculated) : 50.1   Vital Signs: Temp: 98 F (36.7 C) (07/28 0545) Temp src: Oral (07/28 0545) BP: 133/80 mmHg (07/28 0545) Pulse Rate: 88  (07/28 0545)  Labs:  Basename 11/23/11 0441 11/22/11 1401 11/22/11 0608 11/21/11 2315 11/21/11 1703  HGB 9.5* -- 9.6* -- --  HCT 30.3* -- 30.8* -- 36.6  PLT 125* -- 114* -- 134*  APTT -- -- -- -- --  LABPROT 31.3* -- 27.5* -- 23.9*  INR 2.96* -- 2.51* -- 2.10*  HEPARINUNFRC -- -- -- -- --  CREATININE 1.37* -- 1.51* -- 1.74*  CKTOTAL -- 93 95 125 --  CKMB -- 3.2 3.5 4.4* --  TROPONINI -- <0.30 <0.30 <0.30 --    Estimated Creatinine Clearance: 33.1 ml/min (by C-G formula based on Cr of 1.37).   Assessment:  77 YOF on chronic warfarin PTA for atrial fibrillation.   Home dose: 5 mg daily except 7.5 mg on MWF  INR therapeutic but increasing quickly since admission to high end of therapeutic range.  Will hold warfarin for one day.  Hgb down from admission but appears stable today.  No bleeding reported.  Monitoring.  Goal of Therapy:  INR 2-3   Plan:  Hold warfarin today. Follow up INR in AM.   Corinthian Kemler 11/23/2011,11:50 AM

## 2011-12-09 ENCOUNTER — Telehealth: Payer: Self-pay | Admitting: *Deleted

## 2011-12-09 ENCOUNTER — Other Ambulatory Visit (HOSPITAL_BASED_OUTPATIENT_CLINIC_OR_DEPARTMENT_OTHER): Payer: Medicare Other | Admitting: Lab

## 2011-12-09 ENCOUNTER — Ambulatory Visit (HOSPITAL_BASED_OUTPATIENT_CLINIC_OR_DEPARTMENT_OTHER): Payer: Medicare Other | Admitting: Oncology

## 2011-12-09 VITALS — BP 137/77 | HR 139 | Temp 97.8°F | Resp 20 | Ht 62.0 in | Wt 158.0 lb

## 2011-12-09 DIAGNOSIS — D649 Anemia, unspecified: Secondary | ICD-10-CM

## 2011-12-09 DIAGNOSIS — C50419 Malignant neoplasm of upper-outer quadrant of unspecified female breast: Secondary | ICD-10-CM

## 2011-12-09 DIAGNOSIS — Z171 Estrogen receptor negative status [ER-]: Secondary | ICD-10-CM

## 2011-12-09 DIAGNOSIS — C50919 Malignant neoplasm of unspecified site of unspecified female breast: Secondary | ICD-10-CM

## 2011-12-09 LAB — CBC WITH DIFFERENTIAL/PLATELET
Eosinophils Absolute: 0.2 10*3/uL (ref 0.0–0.5)
LYMPH%: 6.2 % — ABNORMAL LOW (ref 14.0–49.7)
MCH: 28 pg (ref 25.1–34.0)
MCV: 88.4 fL (ref 79.5–101.0)
MONO%: 8.6 % (ref 0.0–14.0)
NEUT#: 8.6 10*3/uL — ABNORMAL HIGH (ref 1.5–6.5)
Platelets: 228 10*3/uL (ref 145–400)
RBC: 3.77 10*6/uL (ref 3.70–5.45)

## 2011-12-09 NOTE — Telephone Encounter (Signed)
Gave patient appointment for 06-11-2011 starting at 1:45pm

## 2011-12-09 NOTE — Progress Notes (Signed)
ID: Theo Dills   DOB: [birth year]  MR#: [id number]  NWG#:[id number]  HISTORY OF PRESENT ILLNESS: The patient had routine yearly screening mammography Sep 24, 2010, at [facility].  This suggested a possible asymmetry in the right breast, so the patient proceeded to additional views October 01, 2010.  The mass noted in screening persisted on additional images.  There were irregular margins.  By ultrasonography, this was an irregular hypoechoic mass measuring 1.5 cm.  There was no evidence of axillary adenopathy.  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  The MIB-1 was 90%.   With this information, the patient was referred to Dr. [name] and bilateral breast MRIs were obtained October 15, 2010, at [facility].  This confirmed a 1.4-cm irregular enhancing mass in the upper central right breast.  There were no other suspicious areas, no axillary or internal mammary chain adenopathy, and no suspicious enhancement or changes in the left breast.  Incidental note is of a 4.5-cm left hepatic cyst and 2 smaller likely liver cysts were noted. Her subsequent history is as detailed below.  INTERVAL HISTORY: The patient returns today with her son can for routine followup of her breast cancer.  REVIEW OF SYSTEMS: She doesn't report any new symptoms. She feels chilly frequently, she says because of the Coumadin. Her vision is not good. She has an irregular heartbeat of course. She feels short of breath particularly when she walks a long way in a store. Just sitting there she has no symptoms of concern. She has some urinary leakage, some arthritis and gout symptoms, and she feels forgetful. There has been no pain no fever and no bleeding. A detailed review of systems today was otherwise stable.  PAST MEDICAL HISTORY: Past Medical  History  Diagnosis Date  . Hypertension   . GERD (gastroesophageal reflux disease)   . Glaucoma   . Cataract   . Breast cancer     right  . Valvular heart disease   . Hyperlipidemia   . Gout   Significant for cardiac valve repair/replacement in 2001.  I do not have those records.  The patient has been on Coumadin since that time, followed [name].  She has cardiomegaly by MRI.  She has a history of hypertension, cataracts, glaucoma, and she has problems with ambulation due to damage to her feet, felt to be secondary to working on a concrete floor for many years with inadequate shoes.  She is status post appendectomy and status post bilateral tubal ligation.  There is also a history of atrial fibrillation and history of diastolic dysfunction with some evidence of failure, history of hyperlipidemia, history of chronic venous insufficiency, and history of acute renal failure (please refer to the admission history by Dr. [name] in e-Chart).  PAST SURGICAL HISTORY: Past Surgical History  Procedure Date  . Cardiac valve replacement 2001    per medical history form dated 10/21/10  . Appendectomy 1980's    Per patient son. not sure of date.  . Tubal ligation   . Breast surgery 2012    Right br. Lumpectomy    FAMILY HISTORY Family History  Problem Relation Age of Onset  . Hypertension Mother   . Other Mother     Alzheimers  . Cancer Father     leukemia  . Hypertension Brother   . Other  Brother     breathing problems  The patient's mother died age 35 with ALZ disease.  The patient's father died from leukemia at the age of 7.  The patient had no sisters.  She had 3 brothers.  One died, but she does not know the cause.  The other 2 are living; 1 has emphysema, 1 has gout and high blood pressure.  GYNECOLOGIC HISTORY: She is Gx P6.  First pregnancy to term age 48.  She does not remember when she had menarche.  She went through the change of life around age 36.  She never  took hormone replacement.  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  At home her son, Gay Filler sometimes staysl.  He is disabled secondary to burns.  With her today is her son, [name], who works as a Curator.  His cell number is [phone].  The patient's daughter, [name] works in Designer, fashion/clothing.  Her home number is [phone].  In case of an emergency, Ms. Scearce would want [place] to contact one or both of them.  Ms. Coggins has 5 grandchildren.  She attends [employer].   ADVANCED DIRECTIVES: In place  HEALTH MAINTENANCE: History  Substance Use Topics  . Smoking status: Never Smoker   . Smokeless tobacco: Never Used  . Alcohol Use: No     Colonoscopy:  PAP:  Bone density:  Lipid panel:  No Known Allergies  Current Outpatient Prescriptions  Medication Sig Dispense Refill  . Calcium Carbonate-Vitamin D (CVS CALCIUM 600 + D PO) Take by mouth 2 (two) times daily.        Marland Kitchen diltiazem (CARDIZEM CD) 240 MG 24 hr capsule Take 240 mg by mouth Daily.       . febuxostat (ULORIC) 40 MG tablet Take 40 mg by mouth daily.      . potassium chloride SA (K-DUR,KLOR-CON) 20 MEQ tablet Take 2 tablets (40 mEq total) by mouth 2 (two) times daily.  60 tablet  0  . torsemide (DEMADEX) 20 MG tablet Take 40 mg by mouth daily.       . TRAVATAN Z 0.004 % SOLN ophthalmic solution Place 1 drop into both eyes at bedtime.       Marland Kitchen warfarin (COUMADIN) 2.5 MG tablet Take 5-7.5 mg by mouth daily. 2 tablets daily except for 3 tablets on Monday, Wednesday, Friday.        OBJECTIVE: Elderly African American woman who appears comfortable Filed Vitals:   12/09/11 1519  BP: 137/77  Pulse: 139  Temp: 97.8 F (36.6 C)  Resp: 20     Body mass index is 28.90 kg/(m^2).    ECOG FS: 2  Sclerae unicteric Oropharynx clear No cervical or supraclavicular adenopathy Lungs no rales or rhonchi Heart  shows an irregular rhythm, I do not appreciate a murmur. Abd benign MSK no focal spinal tenderness, chronic venostasis changes left lower extremity worse than right Neuro: nonfocal Breasts: The right breast is firm and smaller than the left. It is hyperpigmented. However I do not palpate anything suspicious for local recurrence. Left breast is unremarkable.  LAB RESULTS: Lab Results  Component Value Date   WBC 10.3 12/09/2011   NEUTROABS 8.6* 12/09/2011   HGB 10.6* 12/09/2011   HCT 33.4* 12/09/2011   MCV 88.4 12/09/2011   PLT 228 12/09/2011      Chemistry  Component Value Date/Time   NA 139 11/23/2011 0441   K 3.0* 11/23/2011 0441   CL 102 11/23/2011 0441   CO2 28 11/23/2011 0441   BUN 39* 11/23/2011 0441   CREATININE 1.37* 11/23/2011 0441      Component Value Date/Time   CALCIUM 8.1* 11/23/2011 0441   ALKPHOS 114 11/23/2011 0441   AST 20 11/23/2011 0441   ALT 12 11/23/2011 0441   BILITOT 0.6 11/23/2011 0441       Lab Results  Component Value Date   LABCA2 32 10/24/2010    No components found with this basename: [id number]    No results found for this basename: INR:1;PROTIME:1 in the last 168 hours  Urinalysis    Component Value Date/Time   COLORURINE YELLOW 11/21/2011 1850   APPEARANCEUR CLOUDY* 11/21/2011 1850   LABSPEC 1.018 11/21/2011 1850   PHURINE 5.0 11/21/2011 1850   GLUCOSEU NEGATIVE 11/21/2011 1850   HGBUR NEGATIVE 11/21/2011 1850   BILIRUBINUR NEGATIVE 11/21/2011 1850   KETONESUR NEGATIVE 11/21/2011 1850   PROTEINUR 100* 11/21/2011 1850   UROBILINOGEN 0.2 11/21/2011 1850   NITRITE NEGATIVE 11/21/2011 1850   LEUKOCYTESUR SMALL* 11/21/2011 1850    STUDIES: Mammography at [facility] June 2013 was unremarkable.  11/21/2011  *RADIOLOGY REPORT*  Clinical Data: 78 year old female with shortness of breath.  PORTABLE CHEST - 1 VIEW  Comparison: 01/03/2011 chest radiograph  Findings: Cardiomegaly, cardiac surgical changes, pulmonary vascular congestion and probable interstitial  pulmonary edema noted. There may be a small right pleural effusion present. There is no evidence of pneumothorax. No acute or suspicious bony abnormalities are noted.  IMPRESSION: Cardiomegaly with pulmonary vascular congestion and probable interstitial edema.  Question small right pleural effusion.  Original Report Authenticated By: Rosendo Gros, M.D.    ASSESSMENT: 78 y.o. [place] woman status post right lumpectomy with no sentinel lymph node sampling July 2012 for a T1c NX high-grade invasive ductal carcinoma, triple negative, with an MIB-1 of 90%. She completed adjuvant radiation October 2012.  PLAN: I see no evidence of active cancer. She will return to see [place] in 6 months and then again 12 months from now. At that point if all continues well, we will start seeing her on a once a year basis.  She has a moderate anemia which is actually improved over the past year. This requires only followup at present.   MAGRINAT,GUSTAV C    12/09/2011

## 2012-01-23 ENCOUNTER — Encounter (HOSPITAL_BASED_OUTPATIENT_CLINIC_OR_DEPARTMENT_OTHER): Payer: Medicare Other | Attending: General Surgery

## 2012-01-23 DIAGNOSIS — Z7901 Long term (current) use of anticoagulants: Secondary | ICD-10-CM | POA: Insufficient documentation

## 2012-01-23 DIAGNOSIS — L97809 Non-pressure chronic ulcer of other part of unspecified lower leg with unspecified severity: Secondary | ICD-10-CM | POA: Insufficient documentation

## 2012-01-23 DIAGNOSIS — I4891 Unspecified atrial fibrillation: Secondary | ICD-10-CM | POA: Insufficient documentation

## 2012-01-23 DIAGNOSIS — I10 Essential (primary) hypertension: Secondary | ICD-10-CM | POA: Insufficient documentation

## 2012-01-23 DIAGNOSIS — I872 Venous insufficiency (chronic) (peripheral): Secondary | ICD-10-CM | POA: Insufficient documentation

## 2012-01-23 DIAGNOSIS — I059 Rheumatic mitral valve disease, unspecified: Secondary | ICD-10-CM | POA: Insufficient documentation

## 2012-01-23 DIAGNOSIS — Z853 Personal history of malignant neoplasm of breast: Secondary | ICD-10-CM | POA: Insufficient documentation

## 2012-01-23 DIAGNOSIS — Z79899 Other long term (current) drug therapy: Secondary | ICD-10-CM | POA: Insufficient documentation

## 2012-01-23 DIAGNOSIS — I509 Heart failure, unspecified: Secondary | ICD-10-CM | POA: Insufficient documentation

## 2012-01-23 NOTE — Progress Notes (Signed)
[facility]  NAME:  DAYLIN, EADS NO.:  [id number]  MEDICAL RECORD NO.:  [id number]      DATE OF BIRTH:  22-Apr-1934  PHYSICIAN:  Ardath Sax, M.D.           VISIT DATE:                                  OFFICE VISIT   This is a 78 year old lady who was sent here by her doctor at [facility].  She has bilateral venous stasis ulcers. She does have good pulses.  She has got some devascularized tissue in the ulcers.  The other history of significance is that she is on anticoagulants for atrial fibrillation.  She has a history of hypertension, congestive heart failure, gout, mitral valve insufficiency, hyperkalemia.  She also has a past history of carcinoma of the breast.  She also has a history of renal failure, venous stasis. She is on Lasix 40 a day, warfarin 2.5 mg a day.  She is on Klor-Con and diltiazem, Travatan ophthalmic solution, and she takes calcium and also takes vitamins.  She was evaluated, and she has pretty good circulation. I do not think a vascular study is necessary.  These ulcers are reasonably superficial.  We are going to treat her with Santyl at home and have her come back in a week.  We wrapped after her in Profore Lite, and we might later put on Unna boots, and we may use Oasis later.     Ardath Sax, M.D.     PP/MEDQ  D:  01/23/2012  T:  01/23/2012  Job:  [id number]

## 2012-01-30 ENCOUNTER — Encounter (HOSPITAL_BASED_OUTPATIENT_CLINIC_OR_DEPARTMENT_OTHER): Payer: Medicare Other | Attending: General Surgery

## 2012-01-30 DIAGNOSIS — L97809 Non-pressure chronic ulcer of other part of unspecified lower leg with unspecified severity: Secondary | ICD-10-CM | POA: Insufficient documentation

## 2012-01-30 DIAGNOSIS — I872 Venous insufficiency (chronic) (peripheral): Secondary | ICD-10-CM | POA: Insufficient documentation

## 2012-01-30 DIAGNOSIS — I87309 Chronic venous hypertension (idiopathic) without complications of unspecified lower extremity: Secondary | ICD-10-CM | POA: Insufficient documentation

## 2012-02-06 ENCOUNTER — Other Ambulatory Visit (HOSPITAL_BASED_OUTPATIENT_CLINIC_OR_DEPARTMENT_OTHER): Payer: Self-pay | Admitting: General Surgery

## 2012-02-06 ENCOUNTER — Ambulatory Visit (HOSPITAL_COMMUNITY)
Admission: RE | Admit: 2012-02-06 | Discharge: 2012-02-06 | Disposition: A | Discharge status: Home / Self Care | Payer: Medicare Other | Source: Ambulatory Visit | Attending: General Surgery | Admitting: General Surgery

## 2012-02-06 DIAGNOSIS — M79673 Pain in unspecified foot: Secondary | ICD-10-CM

## 2012-02-06 DIAGNOSIS — M19079 Primary osteoarthritis, unspecified ankle and foot: Secondary | ICD-10-CM | POA: Insufficient documentation

## 2012-02-06 DIAGNOSIS — M14679 Charcot's joint, unspecified ankle and foot: Secondary | ICD-10-CM

## 2012-02-06 DIAGNOSIS — M109 Gout, unspecified: Secondary | ICD-10-CM | POA: Insufficient documentation

## 2012-02-06 DIAGNOSIS — N189 Chronic kidney disease, unspecified: Secondary | ICD-10-CM | POA: Insufficient documentation

## 2012-02-06 DIAGNOSIS — M949 Disorder of cartilage, unspecified: Secondary | ICD-10-CM | POA: Insufficient documentation

## 2012-02-06 DIAGNOSIS — M899 Disorder of bone, unspecified: Secondary | ICD-10-CM | POA: Insufficient documentation

## 2012-02-06 DIAGNOSIS — M79609 Pain in unspecified limb: Secondary | ICD-10-CM | POA: Insufficient documentation

## 2012-02-20 ENCOUNTER — Encounter (HOSPITAL_BASED_OUTPATIENT_CLINIC_OR_DEPARTMENT_OTHER): Payer: Medicare Other

## 2012-02-27 ENCOUNTER — Encounter (HOSPITAL_BASED_OUTPATIENT_CLINIC_OR_DEPARTMENT_OTHER): Payer: Medicare Other | Attending: General Surgery

## 2012-02-27 DIAGNOSIS — I87319 Chronic venous hypertension (idiopathic) with ulcer of unspecified lower extremity: Secondary | ICD-10-CM | POA: Insufficient documentation

## 2012-02-27 DIAGNOSIS — L97909 Non-pressure chronic ulcer of unspecified part of unspecified lower leg with unspecified severity: Secondary | ICD-10-CM | POA: Insufficient documentation

## 2012-04-02 ENCOUNTER — Encounter (HOSPITAL_BASED_OUTPATIENT_CLINIC_OR_DEPARTMENT_OTHER): Payer: Medicare Other | Attending: General Surgery

## 2012-04-02 ENCOUNTER — Encounter (HOSPITAL_BASED_OUTPATIENT_CLINIC_OR_DEPARTMENT_OTHER): Payer: Medicare Other

## 2012-04-02 DIAGNOSIS — L97909 Non-pressure chronic ulcer of unspecified part of unspecified lower leg with unspecified severity: Secondary | ICD-10-CM | POA: Insufficient documentation

## 2012-05-16 IMAGING — CR DG ABDOMEN 2V
1 series · 3 of 3 positions shown · non-contrast
Comparison: None.

CLINICAL DATA: Abdominal pain and distension.

ABDOMEN - 2 VIEW

[Series 1: ap (kub) · U · 3 of 3 slices shown]
[im 1/3]
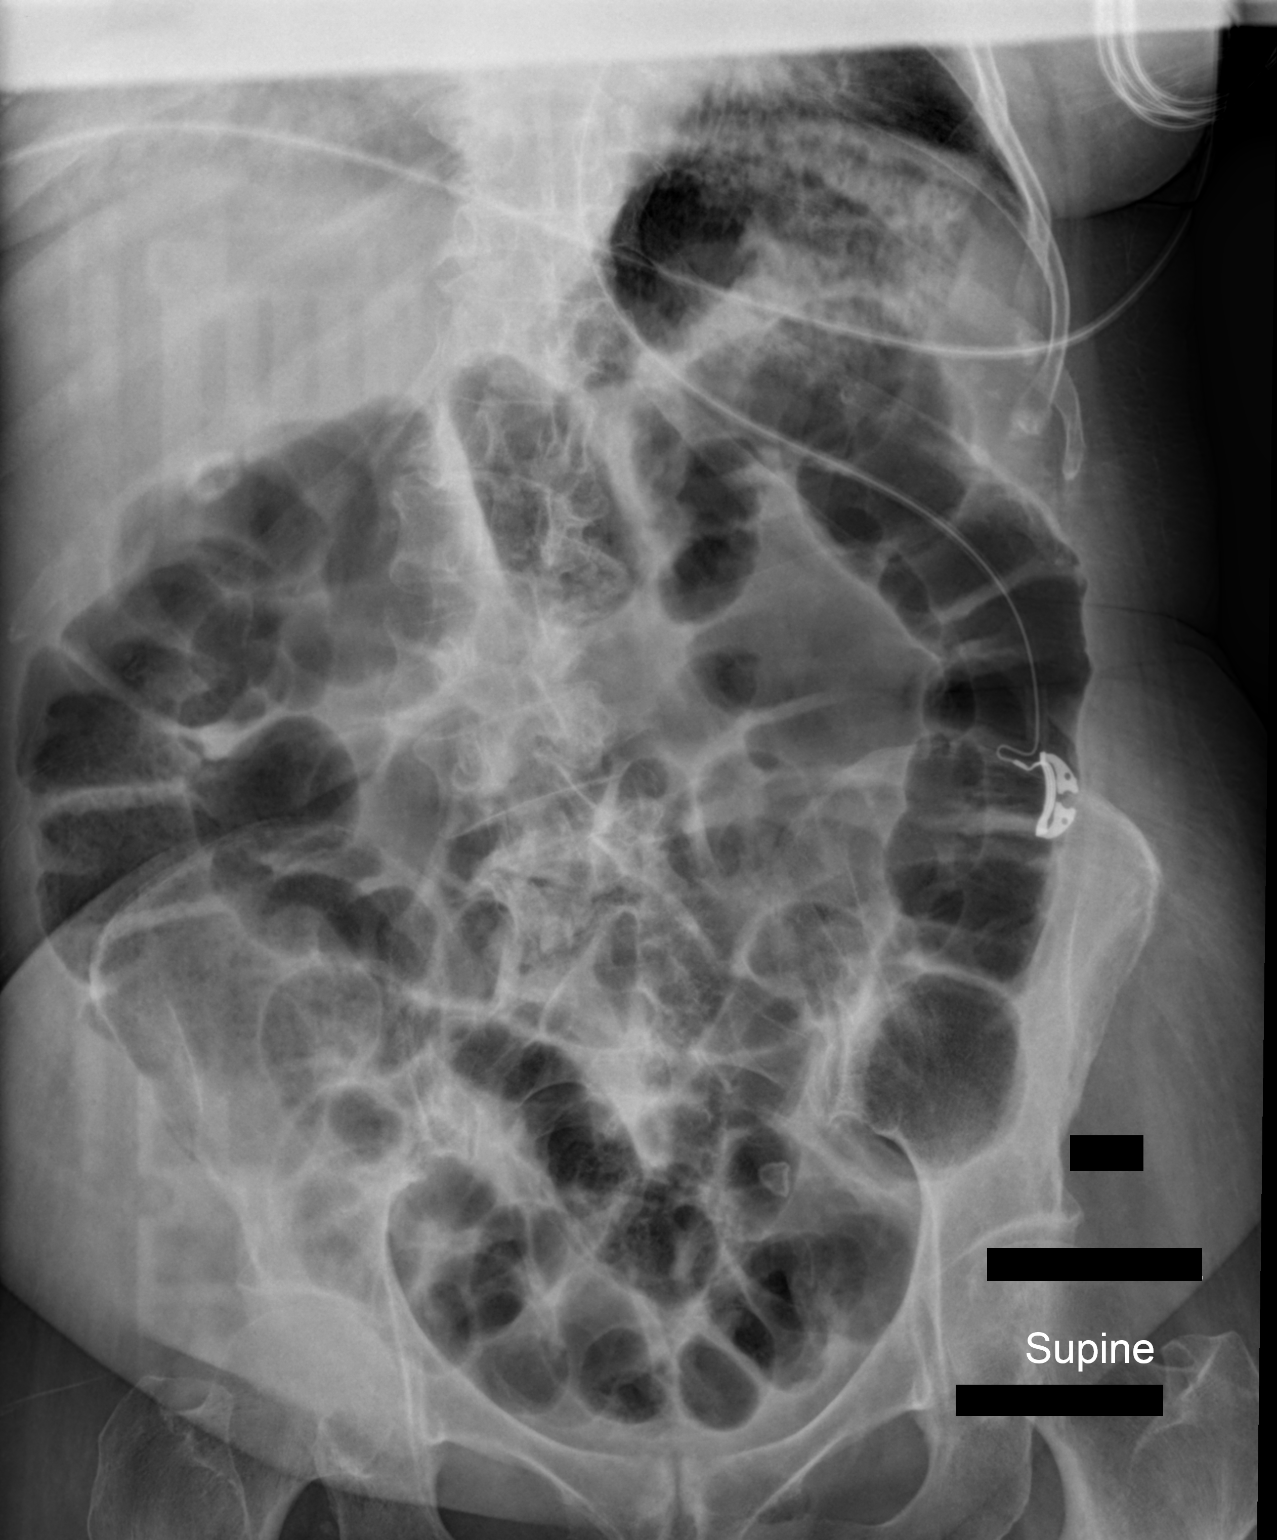
[im 2/3]
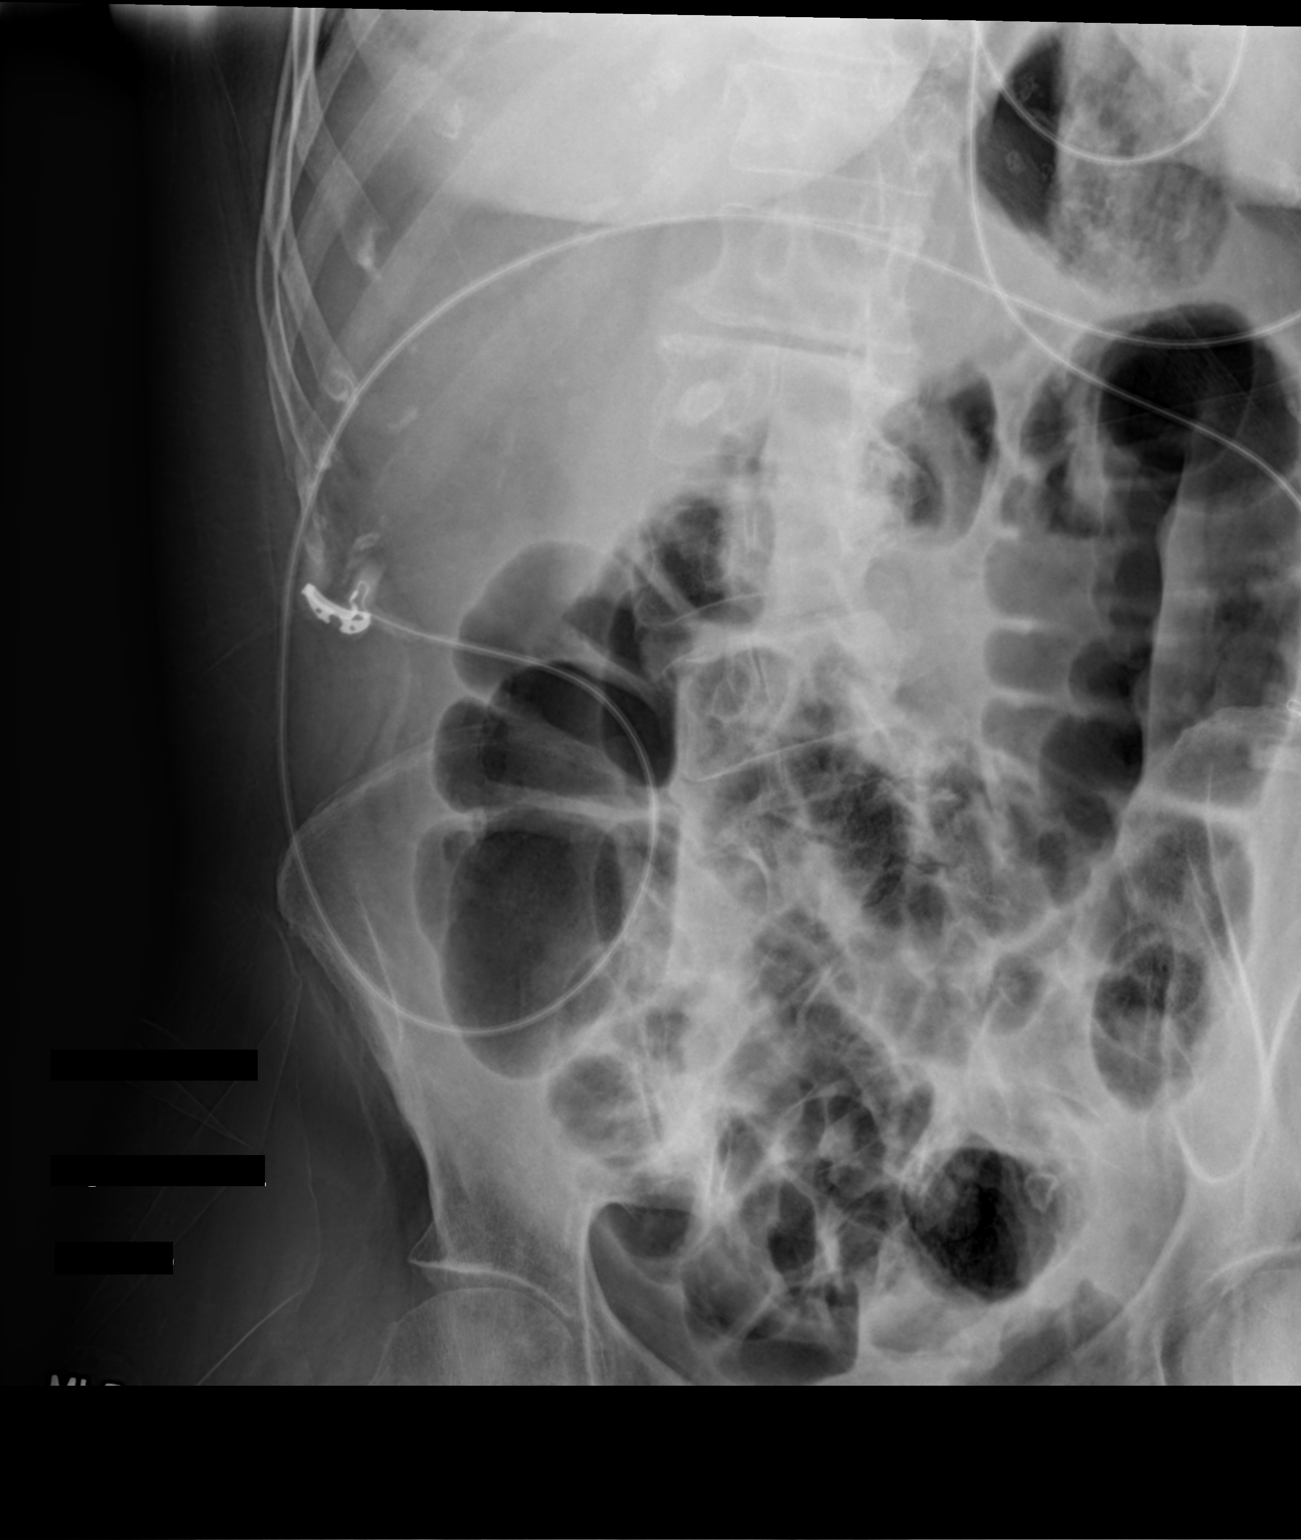
[im 3/3]
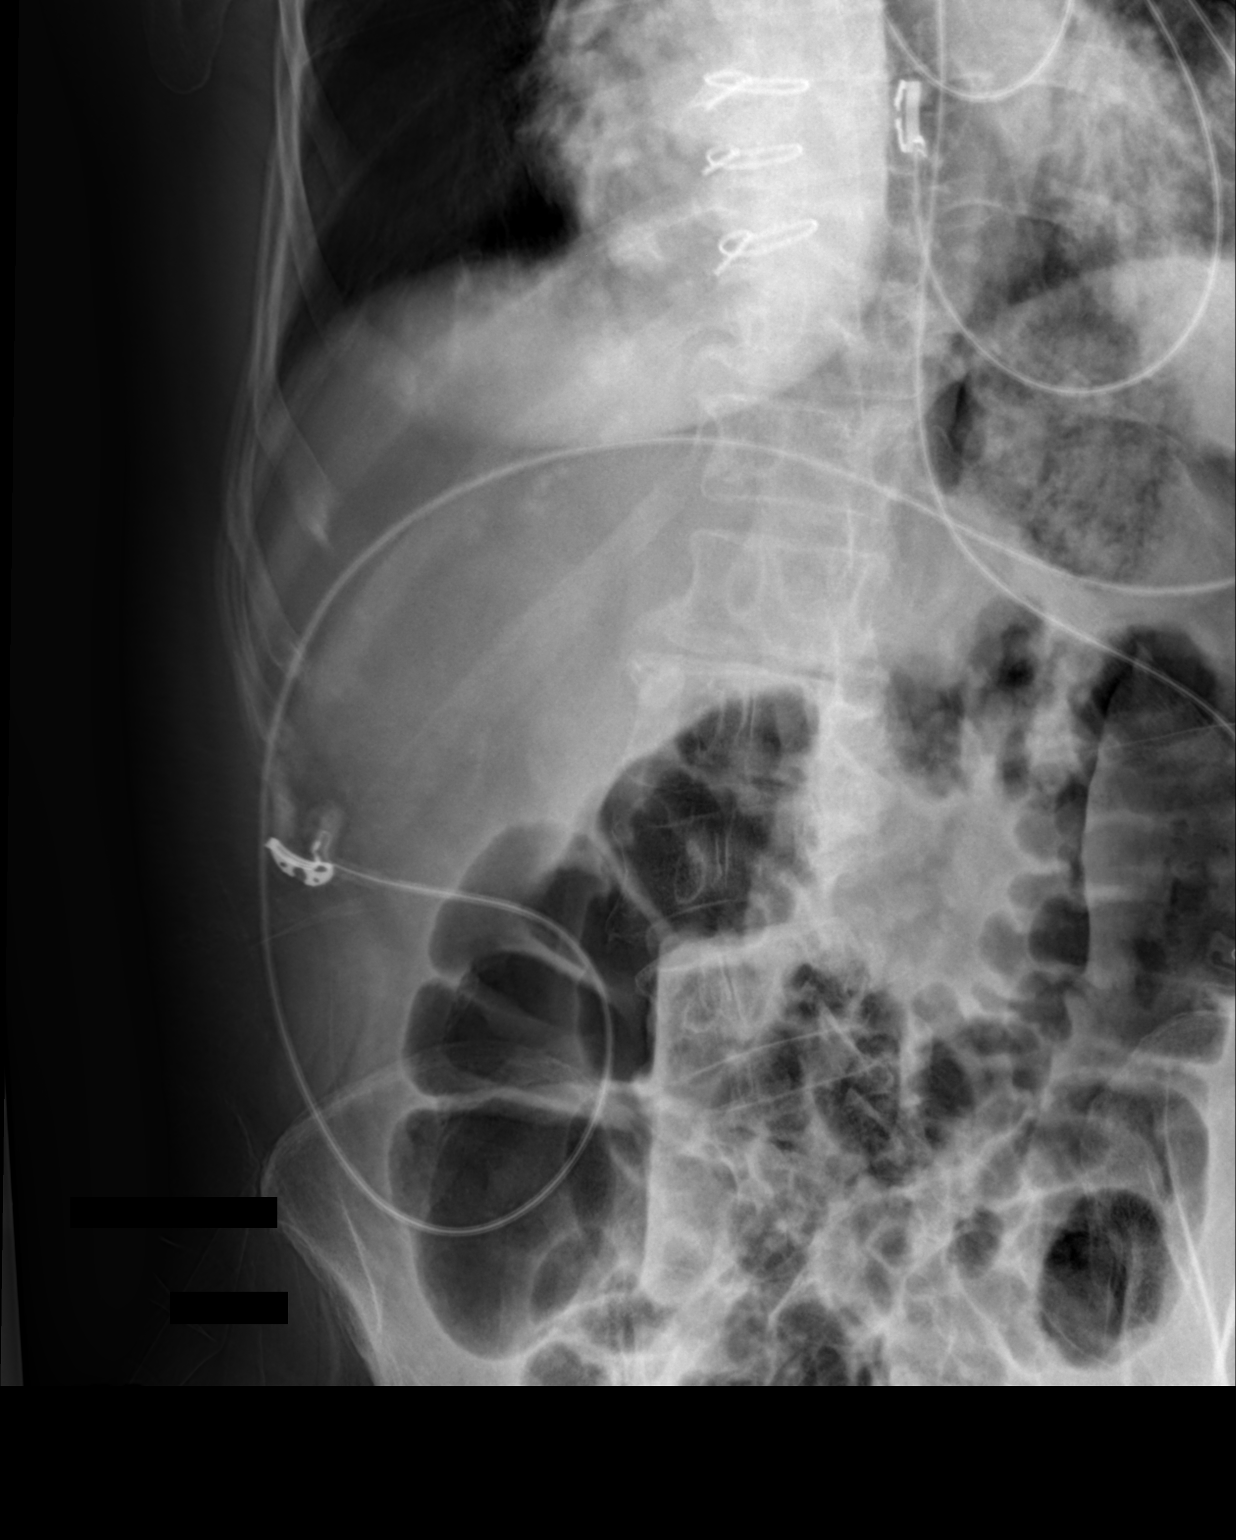

[3 of 3 positions shown; findings below may reference images not displayed]

FINDINGS: 7507 hours.  There is air throughout the small and large
bowel which are mildly distended.  No bowel wall thickening is
identified.  This portable supine film is not optimal for exclusion
of pneumoperitoneum - there is no evidence for that.  There is a
convex right scoliosis with associated multilevel spondylosis.
IMPRESSION: Mild diffuse small and large bowel distension most consistent with
an ileus.  Early distal obstruction could have this appearance and
follow-up is recommended.

## 2012-05-16 IMAGING — CR DG SHOULDER 1V*L*
1 series · 2 of 2 positions shown · non-contrast
Comparison: None.

CLINICAL DATA: Left shoulder pain.

PORTABLE LEFT SHOULDER - 2+ VIEW

[Series 1: ap int/ext rotation · left · 2 of 2 slices shown]
[im 1/2]
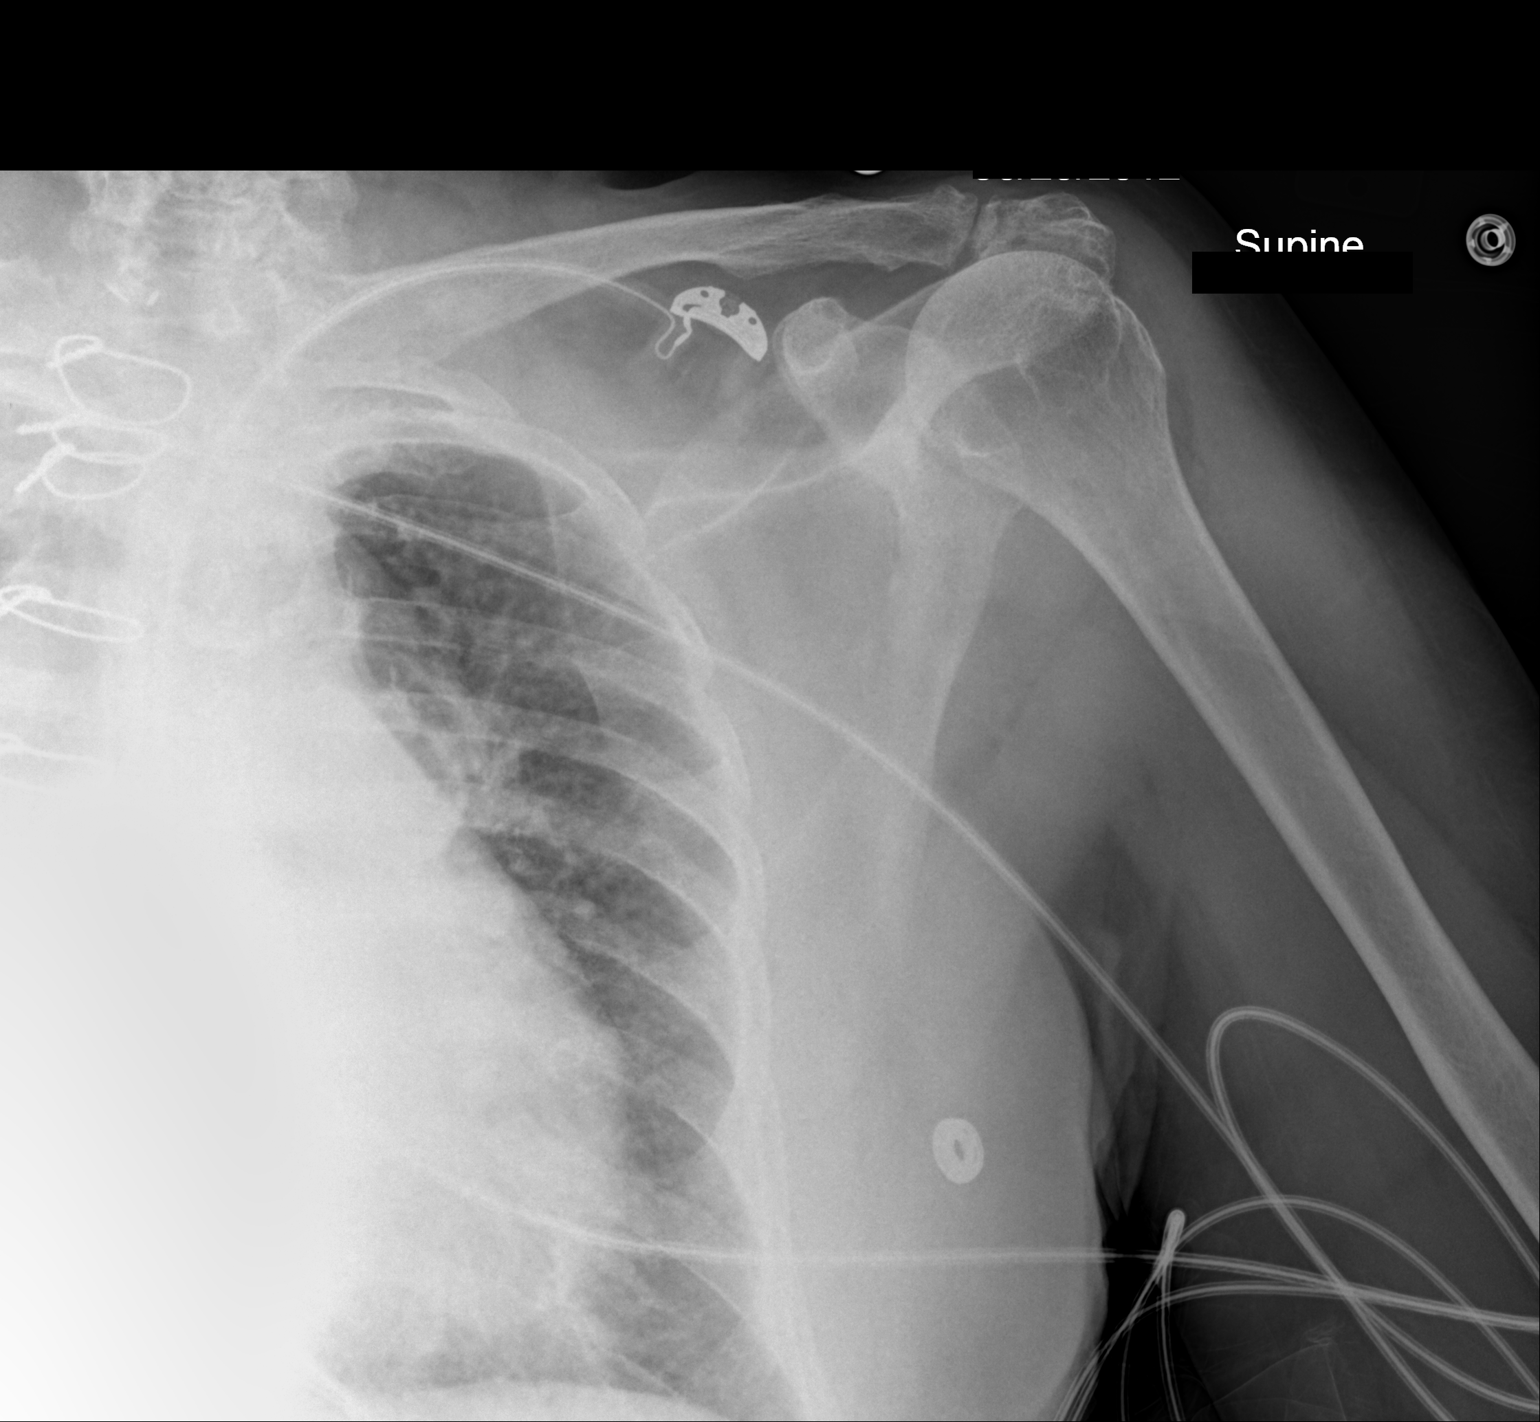
[im 2/2]
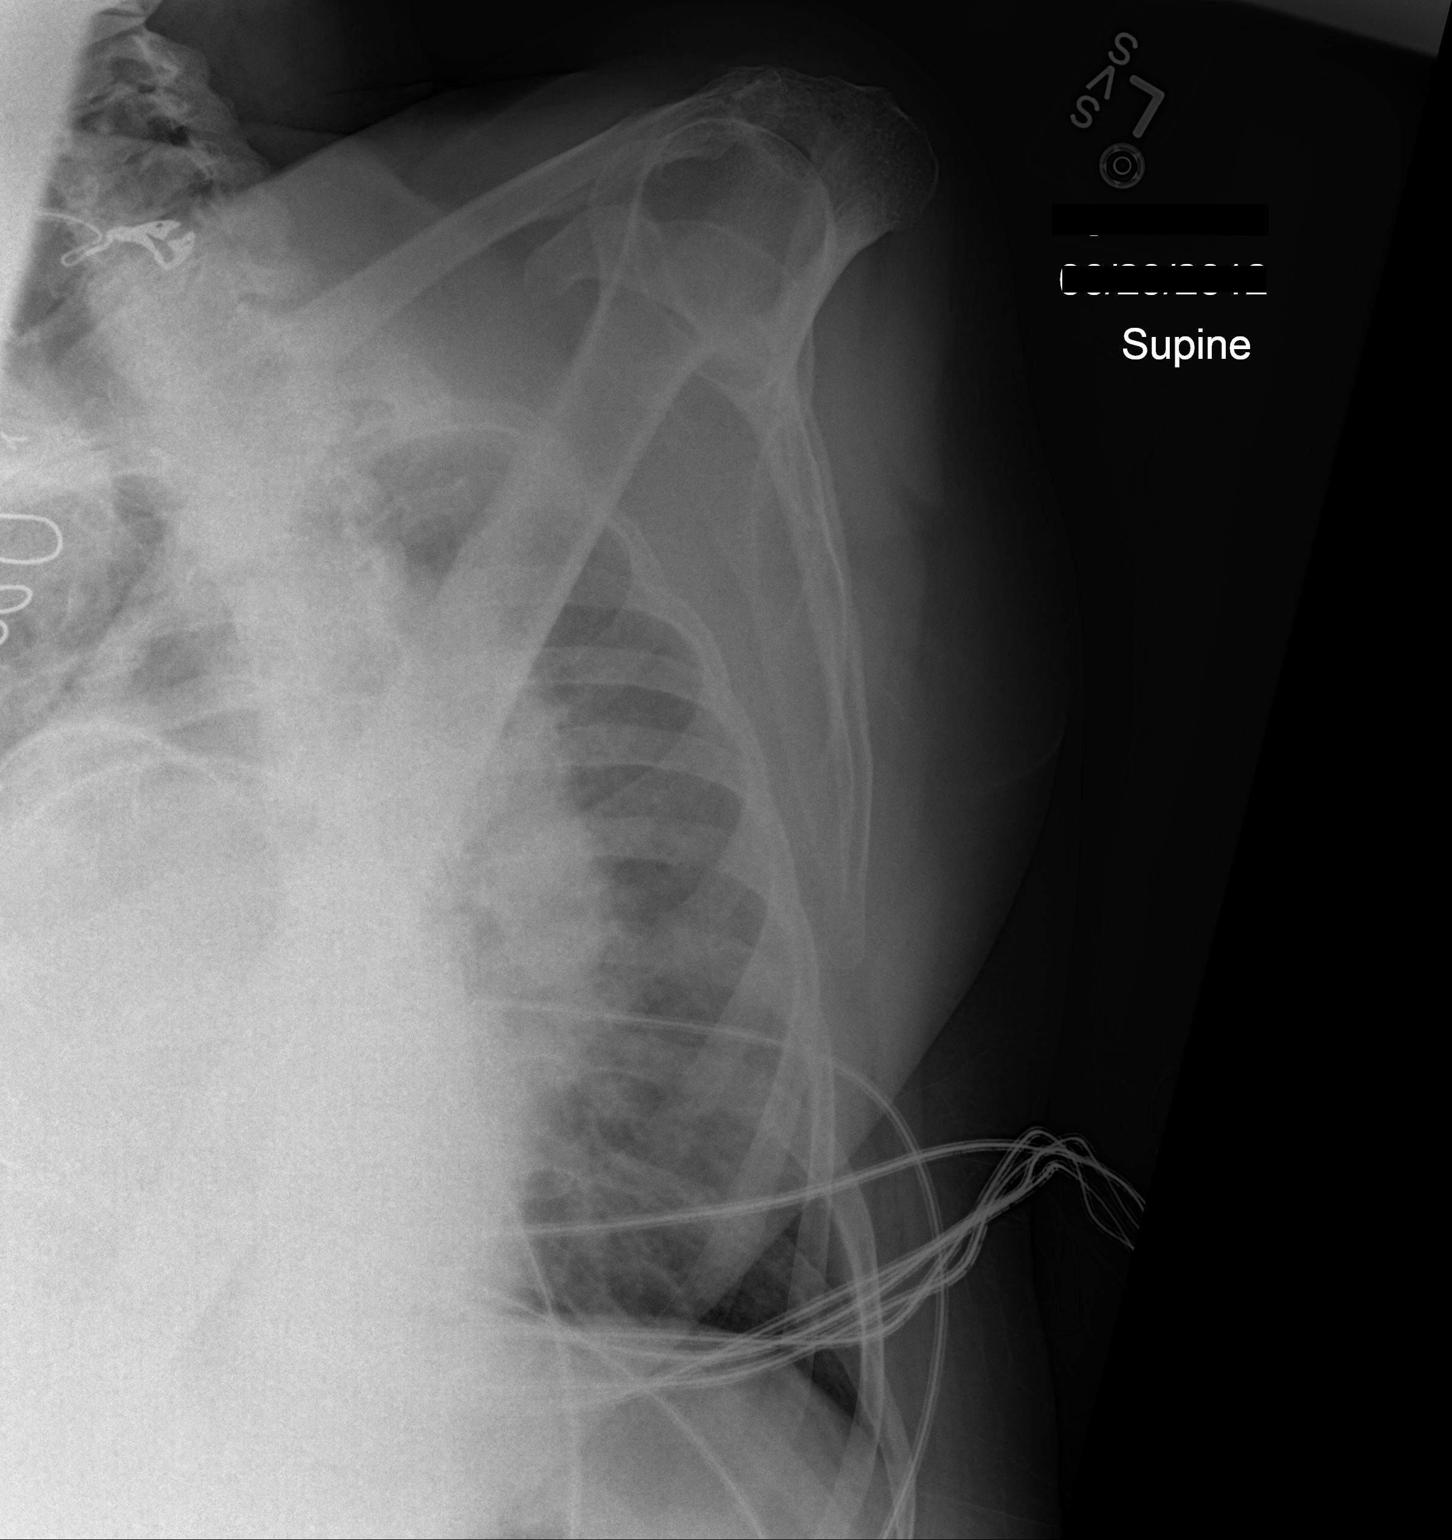

[2 of 2 positions shown; findings below may reference images not displayed]

FINDINGS: Two-view portable study shows no evidence for an acute
fracture.  No evidence for shoulder separation or dislocation.
Decrease in acromiohumeral distance suggests chronic rotator cuff
pathology and there are degenerative changes in the
acromioclavicular joint and at the rotator cuff insertion.
IMPRESSION: Degenerative changes without acute bony findings.

## 2012-06-08 ENCOUNTER — Telehealth: Payer: Self-pay | Admitting: Oncology

## 2012-06-08 ENCOUNTER — Other Ambulatory Visit: Payer: Self-pay | Admitting: Physician Assistant

## 2012-06-08 NOTE — Telephone Encounter (Signed)
Moved 2/13 appt to 2/24 @ 1:45pm for lb and 2:15pm for AB. Per AB she has contacted pt.

## 2012-06-10 ENCOUNTER — Ambulatory Visit: Payer: Medicare Other | Admitting: Oncology

## 2012-06-10 ENCOUNTER — Ambulatory Visit: Payer: Medicare Other | Admitting: Physician Assistant

## 2012-06-10 ENCOUNTER — Other Ambulatory Visit: Payer: Medicare Other | Admitting: Lab

## 2012-06-21 ENCOUNTER — Encounter: Payer: Self-pay | Admitting: Physician Assistant

## 2012-06-21 ENCOUNTER — Other Ambulatory Visit (HOSPITAL_BASED_OUTPATIENT_CLINIC_OR_DEPARTMENT_OTHER): Payer: Medicare Other

## 2012-06-21 ENCOUNTER — Ambulatory Visit (HOSPITAL_BASED_OUTPATIENT_CLINIC_OR_DEPARTMENT_OTHER): Payer: Medicare Other | Admitting: Physician Assistant

## 2012-06-21 ENCOUNTER — Telehealth: Payer: Self-pay | Admitting: Oncology

## 2012-06-21 VITALS — BP 157/76 | HR 71 | Temp 98.5°F | Resp 20 | Ht 62.0 in | Wt 152.6 lb

## 2012-06-21 DIAGNOSIS — C50419 Malignant neoplasm of upper-outer quadrant of unspecified female breast: Secondary | ICD-10-CM

## 2012-06-21 DIAGNOSIS — Z171 Estrogen receptor negative status [ER-]: Secondary | ICD-10-CM

## 2012-06-21 LAB — CBC WITH DIFFERENTIAL/PLATELET
BASO%: 0.3 % (ref 0.0–2.0)
LYMPH%: 10 % — ABNORMAL LOW (ref 14.0–49.7)
MCH: 28.7 pg (ref 25.1–34.0)
MCHC: 33.4 g/dL (ref 31.5–36.0)
MCV: 86 fL (ref 79.5–101.0)
MONO#: 0.7 10*3/uL (ref 0.1–0.9)
MONO%: 7.4 % (ref 0.0–14.0)
Platelets: 141 10*3/uL — ABNORMAL LOW (ref 145–400)
RBC: 3.92 10*6/uL (ref 3.70–5.45)
WBC: 9.4 10*3/uL (ref 3.9–10.3)

## 2012-06-21 NOTE — Telephone Encounter (Signed)
gv pt appt schedule for August and Mammo appt for 6/6 @ 10:15am @ [facility].

## 2012-06-21 NOTE — Progress Notes (Signed)
ID: [name]   DOB: 11-04-1933  MR#: [id number]  GNF#:[id number]  HISTORY OF PRESENT ILLNESS: The patient had routine yearly screening mammography Sep 24, 2010, at [facility].  This suggested a possible asymmetry in the right breast, so the patient proceeded to additional views October 01, 2010.  The mass noted in screening persisted on additional images.  There were irregular margins.  By ultrasonography, this was an irregular hypoechoic mass measuring 1.5 cm.  There was no evidence of axillary adenopathy.  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  The MIB-1 was 90%.   With this information, the patient was referred to [name] and bilateral breast MRIs were obtained October 15, 2010, at [facility].  This confirmed a 1.4-cm irregular enhancing mass in the upper central right breast.  There were no other suspicious areas, no axillary or internal mammary chain adenopathy, and no suspicious enhancement or changes in the left breast.  Incidental note is of a 4.5-cm left hepatic cyst and 2 smaller likely liver cysts were noted. Her subsequent history is as detailed below.  INTERVAL HISTORY: The patient returns today  for routine followup of her right breast cancer.  Interval history is unremarkable, and [name] tells me she has been doing well. She has some problems with arthritis and is ambulating with a cane. She was recently evaluated by her cardiologist, [name], and tells me "everything was good". Her blood pressure was a little elevated here today of 187/89. We had her relax for a few minutes, repeated today, and it had come down to 157/76.  REVIEW OF SYSTEMS: [name] has had no recent fevers or chills. Her energy level is fair. Her appetite is good. She denies any problems with nausea, emesis,  diarrhea, or constipation. She has some mild urinary incontinence, but no change in urinary habits. She's had no cough, increased shortness of breath, chest pain, or palpitations. No abnormal headaches or dizziness. No peripheral swelling.  A detailed review of systems is otherwise stable and noncontributory.    PAST MEDICAL HISTORY: Past Medical History  Diagnosis Date  . Hypertension   . GERD (gastroesophageal reflux disease)   . Glaucoma(365)   . Cataract   . Breast cancer     right  . Valvular heart disease   . Hyperlipidemia   . Gout   Significant for cardiac valve repair/replacement in 2001.  I do not have those records.  The patient has been on Coumadin since that time, followed through [name]'s group.  She has cardiomegaly by MRI.  She has a history of hypertension, cataracts, glaucoma, and she has problems with ambulation due to damage to her feet, felt to be secondary to working on a concrete floor for many years with inadequate shoes.  She is status post appendectomy and status post bilateral tubal ligation.  There is also a history of atrial fibrillation and history of diastolic dysfunction with some evidence of failure, history of hyperlipidemia, history of chronic venous insufficiency, and history of acute renal failure (please refer to the admission history by [name] in e-Chart).  PAST SURGICAL HISTORY: Past Surgical History  Procedure Laterality Date  . Cardiac valve replacement  2001    per medical history form dated 10/21/10  . Appendectomy  1980's    Per patient son. not sure of date.  Marland Kitchen  Tubal ligation    . Breast surgery  2012    Right br. Lumpectomy    FAMILY HISTORY Family History  Problem Relation Age of Onset  . Hypertension Mother   . Other Mother     Alzheimers  . Cancer Father     leukemia  . Hypertension Brother   . Other Brother     breathing problems  The patient's mother died age 35 with ALZ disease.  The patient's father died from  leukemia at the age of 4.  The patient had no sisters.  She had 3 brothers.  One died, but she does not know the cause.  The other 2 are living; 1 has emphysema, 1 has gout and high blood pressure.  GYNECOLOGIC HISTORY: She is Gx P6.  First pregnancy to term age 67.  She does not remember when she had menarche.  She went through the change of life around age 72.  She never took hormone replacement.  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  At home her son, [name] sometimes staysl.  He is disabled secondary to burns.  With her today is her son, [name], who works as a Curator.  His cell number is [phone].  The patient's daughter, [name] works in Designer, fashion/clothing.  Her home number is [phone].  In case of an emergency, [name] would want [place] to contact one or both of them.  [name] has 5 grandchildren.  She attends [employer].   ADVANCED DIRECTIVES: In place  HEALTH MAINTENANCE: History  Substance Use Topics  . Smoking status: Never Smoker   . Smokeless tobacco: Never Used  . Alcohol Use: No     Colonoscopy:  PAP:  Bone density:  Lipid panel:  No Known Allergies  Current Outpatient Prescriptions  Medication Sig Dispense Refill  . acetaminophen (TYLENOL) 325 MG tablet Take 325 mg by mouth every 6 (six) hours as needed for pain.      . Calcium Carbonate-Vitamin D ([employer] CALCIUM 600 + D PO) Take by mouth 2 (two) times daily.        Marland Kitchen diltiazem (CARDIZEM CD) 240 MG 24 hr capsule Take 360 mg by mouth Daily.       . febuxostat (ULORIC) 40 MG tablet Take 40 mg by mouth daily.      . potassium chloride SA (K-DUR,KLOR-CON) 20 MEQ tablet Take 10 mEq by mouth 2 (two) times daily.      Marland Kitchen torsemide (DEMADEX) 20 MG tablet Take 40 mg by mouth daily.       . TRAVATAN Z 0.004 % SOLN ophthalmic solution Place 1 drop into both eyes at bedtime.       Marland Kitchen warfarin  (COUMADIN) 2.5 MG tablet Take 5-7.5 mg by mouth daily. 2 tablets daily except for 3 tablets on Monday, Wednesday, Friday.       No current facility-administered medications for this visit.    OBJECTIVE: Elderly African American woman who appears comfortable Filed Vitals:   06/21/12 1518  BP: 157/76  Pulse: 71  Temp:   Resp:      Body mass index is 27.9 kg/(m^2).    ECOG FS: 2 Filed Weights   06/21/12 1449  Weight: 152 lb 9.6 oz (69.219 kg)    Sclerae unicteric Oropharynx clear No cervical or supraclavicular adenopathy Lungs no rales or rhonchi Heart regular rate,  irregular  rhythm Abdomen soft, nontender, positive bowel sounds MSK no focal spinal tenderness to gentle palpation Nonpitting pedal edema bilaterally, no upper extremity edema noted Neuro: nonfocal, well oriented Breasts: The right breast is status post lumpectomy. It is firm to palpation and smaller than the left. It is hyperpigmented. However I do not palpate anything suspicious for local recurrence. Left breast is unremarkable. Axillae benign bilaterally with no palpable adenopathy  LAB RESULTS: Lab Results  Component Value Date   WBC 9.4 06/21/2012   NEUTROABS 7.6* 06/21/2012   HGB 11.3* 06/21/2012   HCT 33.7* 06/21/2012   MCV 86.0 06/21/2012   PLT 141* 06/21/2012      Chemistry      Component Value Date/Time   NA 139 11/23/2011 0441   K 3.0* 11/23/2011 0441   CL 102 11/23/2011 0441   CO2 28 11/23/2011 0441   BUN 39* 11/23/2011 0441   CREATININE 1.37* 11/23/2011 0441      Component Value Date/Time   CALCIUM 8.1* 11/23/2011 0441   ALKPHOS 114 11/23/2011 0441   AST 20 11/23/2011 0441   ALT 12 11/23/2011 0441   BILITOT 0.6 11/23/2011 0441       Lab Results  Component Value Date   LABCA2 32 10/24/2010     STUDIES:   Mammography at [facility] June 2013 was unremarkable.  11/21/2011  *RADIOLOGY REPORT*  Clinical Data: 79 year old female with shortness of breath.  PORTABLE CHEST - 1 VIEW  Comparison: 01/03/2011  chest radiograph  Findings: Cardiomegaly, cardiac surgical changes, pulmonary vascular congestion and probable interstitial pulmonary edema noted. There may be a small right pleural effusion present. There is no evidence of pneumothorax. No acute or suspicious bony abnormalities are noted.  IMPRESSION: Cardiomegaly with pulmonary vascular congestion and probable interstitial edema.  Question small right pleural effusion.  Original Report Authenticated By: Rosendo Gros, M.D.    ASSESSMENT: 79 y.o. [place] woman   (1)  status post right lumpectomy with no sentinel lymph node sampling July 2012 for a T1c NX high-grade invasive ductal carcinoma, triple negative, with an MIB-1 of 90%.   (2)  She completed adjuvant radiation October 2012.  PLAN: With regards to her breast cancer, [place] seems to be doing well. She is due for her next annual mammogram at [facility] in June, and we'll see [name] 6 months from now in August 2014. She is still doing well, we will likely begin seeing her on a once a year basis at that point.  [place] voices understanding and agreement with this plan, and will call with any changes or problems.  [name]    06/21/2012

## 2012-07-11 IMAGING — NM NM HEPATO W/GB/PHARM/[PERSON_NAME]
2 series · 12 of 12 positions shown · non-contrast
Comparison: None.

CLINICAL DATA: Cholelithiasis.  Abdominal pain.

NUCLEAR MEDICINE HEPATOBILIARY IMAGING WITH GALLBLADDER EF
TECHNIQUE: Sequential images of the abdomen were obtained [DATE] minutes following intravenous administration of
radiopharmaceutical.  After slow intravenous infusion of
micrograms Cholecystokinin, gallbladder ejection fraction was
determined.
Radiopharmaceutical:  5.4 mCi Mc-11m Choletec

[he hepato · 4.75mm/px · 6 of 30 frames shown (1 of 2)]
[frame 3/30]
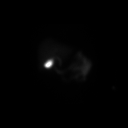
[frame 8/30]
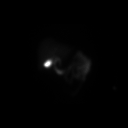
[frame 13/30]
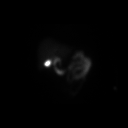
[frame 18/30]
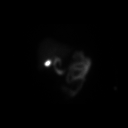
[frame 23/30]
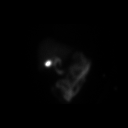
[frame 28/30]
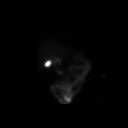

[he hepato · 4.75mm/px · 6 of 60 frames shown (2 of 2)]
[frame 6/60]
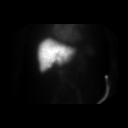
[frame 16/60]
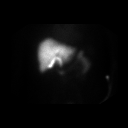
[frame 26/60]
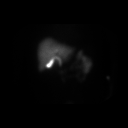
[frame 36/60]
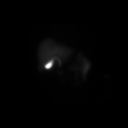
[frame 46/60]
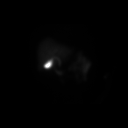
[frame 56/60]
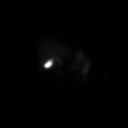

[12 of 12 positions shown; findings below may reference images not displayed]

FINDINGS: Prompt radiopharmaceutical uptake by the liver is seen.
Liver is normal in appearance.  Prompt biliary excretion of
activity is seen.  Gallbladder activity is seen initially on the 5-
day image.  Biliary activity also reaches the small bowel initially
on the 5-minute image.

Following intravenous infusion of cholecystokinin, the gallbladder
ejection fraction reaches 57%, which is within normal limits.

The patient did not experience symptoms during CCK infusion.
IMPRESSION: Normal study.  Gallbladder ejection fraction within normal limits.

## 2012-10-27 ENCOUNTER — Encounter: Payer: Self-pay | Admitting: Oncology

## 2012-10-29 ENCOUNTER — Inpatient Hospital Stay (HOSPITAL_BASED_OUTPATIENT_CLINIC_OR_DEPARTMENT_OTHER)
Admission: EM | Admit: 2012-10-29 | Discharge: 2012-11-08 | DRG: 388 | Disposition: A | Discharge status: Home / Self Care | Payer: Medicare Other | Attending: Internal Medicine | Admitting: Internal Medicine

## 2012-10-29 DIAGNOSIS — K219 Gastro-esophageal reflux disease without esophagitis: Secondary | ICD-10-CM | POA: Diagnosis present

## 2012-10-29 DIAGNOSIS — N179 Acute kidney failure, unspecified: Secondary | ICD-10-CM

## 2012-10-29 DIAGNOSIS — H409 Unspecified glaucoma: Secondary | ICD-10-CM | POA: Diagnosis present

## 2012-10-29 DIAGNOSIS — Z9889 Other specified postprocedural states: Secondary | ICD-10-CM

## 2012-10-29 DIAGNOSIS — I5031 Acute diastolic (congestive) heart failure: Secondary | ICD-10-CM

## 2012-10-29 DIAGNOSIS — N183 Chronic kidney disease, stage 3 unspecified: Secondary | ICD-10-CM | POA: Diagnosis present

## 2012-10-29 DIAGNOSIS — Z954 Presence of other heart-valve replacement: Secondary | ICD-10-CM

## 2012-10-29 DIAGNOSIS — I4891 Unspecified atrial fibrillation: Secondary | ICD-10-CM | POA: Diagnosis present

## 2012-10-29 DIAGNOSIS — E785 Hyperlipidemia, unspecified: Secondary | ICD-10-CM | POA: Diagnosis present

## 2012-10-29 DIAGNOSIS — Z853 Personal history of malignant neoplasm of breast: Secondary | ICD-10-CM

## 2012-10-29 DIAGNOSIS — C50919 Malignant neoplasm of unspecified site of unspecified female breast: Secondary | ICD-10-CM | POA: Diagnosis present

## 2012-10-29 DIAGNOSIS — I129 Hypertensive chronic kidney disease with stage 1 through stage 4 chronic kidney disease, or unspecified chronic kidney disease: Secondary | ICD-10-CM | POA: Diagnosis present

## 2012-10-29 DIAGNOSIS — I5032 Chronic diastolic (congestive) heart failure: Secondary | ICD-10-CM | POA: Diagnosis present

## 2012-10-29 DIAGNOSIS — R001 Bradycardia, unspecified: Secondary | ICD-10-CM

## 2012-10-29 DIAGNOSIS — I10 Essential (primary) hypertension: Secondary | ICD-10-CM | POA: Diagnosis present

## 2012-10-29 DIAGNOSIS — K56609 Unspecified intestinal obstruction, unspecified as to partial versus complete obstruction: Secondary | ICD-10-CM | POA: Diagnosis present

## 2012-10-29 DIAGNOSIS — M109 Gout, unspecified: Secondary | ICD-10-CM | POA: Diagnosis present

## 2012-10-29 DIAGNOSIS — K439 Ventral hernia without obstruction or gangrene: Secondary | ICD-10-CM | POA: Diagnosis present

## 2012-10-29 DIAGNOSIS — I5033 Acute on chronic diastolic (congestive) heart failure: Secondary | ICD-10-CM | POA: Diagnosis present

## 2012-10-29 DIAGNOSIS — I509 Heart failure, unspecified: Secondary | ICD-10-CM | POA: Diagnosis present

## 2012-10-29 DIAGNOSIS — Z7901 Long term (current) use of anticoagulants: Secondary | ICD-10-CM

## 2012-10-29 DIAGNOSIS — I498 Other specified cardiac arrhythmias: Secondary | ICD-10-CM | POA: Diagnosis present

## 2012-10-29 DIAGNOSIS — K565 Intestinal adhesions [bands], unspecified as to partial versus complete obstruction: Principal | ICD-10-CM | POA: Diagnosis present

## 2012-10-29 HISTORY — DX: Heart failure, unspecified: I50.9

## 2012-10-29 HISTORY — DX: Shortness of breath: R06.02

## 2012-10-29 HISTORY — DX: Cardiac murmur, unspecified: R01.1

## 2012-10-29 NOTE — ED Notes (Signed)
Pt with some minimal abdominal for past several weeks, tonight pt reports sudden onset of lower abdominal cramping after eating fish at a cook out, no other attendees are c/o abdominal pain

## 2012-10-30 ENCOUNTER — Emergency Department (HOSPITAL_BASED_OUTPATIENT_CLINIC_OR_DEPARTMENT_OTHER): Payer: Medicare Other

## 2012-10-30 ENCOUNTER — Encounter (HOSPITAL_BASED_OUTPATIENT_CLINIC_OR_DEPARTMENT_OTHER): Payer: Self-pay | Admitting: Emergency Medicine

## 2012-10-30 DIAGNOSIS — I5031 Acute diastolic (congestive) heart failure: Secondary | ICD-10-CM

## 2012-10-30 DIAGNOSIS — K439 Ventral hernia without obstruction or gangrene: Secondary | ICD-10-CM | POA: Diagnosis present

## 2012-10-30 DIAGNOSIS — K56609 Unspecified intestinal obstruction, unspecified as to partial versus complete obstruction: Secondary | ICD-10-CM

## 2012-10-30 DIAGNOSIS — I4891 Unspecified atrial fibrillation: Secondary | ICD-10-CM

## 2012-10-30 LAB — COMPREHENSIVE METABOLIC PANEL
ALT: 16 U/L (ref 0–35)
AST: 23 U/L (ref 0–37)
CO2: 27 mEq/L (ref 19–32)
Calcium: 10.3 mg/dL (ref 8.4–10.5)
Chloride: 102 mEq/L (ref 96–112)
GFR calc non Af Amer: 43 mL/min — ABNORMAL LOW (ref >90)
Sodium: 142 mEq/L (ref 135–145)
Total Bilirubin: 0.7 mg/dL (ref 0.3–1.2)

## 2012-10-30 LAB — CBC WITH DIFFERENTIAL/PLATELET
Basophils Absolute: 0 10*3/uL (ref 0.0–0.1)
Lymphocytes Relative: 5 % — ABNORMAL LOW (ref 12–46)
Neutro Abs: 10.9 10*3/uL — ABNORMAL HIGH (ref 1.7–7.7)
Neutrophils Relative %: 91 % — ABNORMAL HIGH (ref 43–77)
Platelets: 141 10*3/uL — ABNORMAL LOW (ref 150–400)
RDW: 17.5 % — ABNORMAL HIGH (ref 11.5–15.5)
WBC: 12 10*3/uL — ABNORMAL HIGH (ref 4.0–10.5)

## 2012-10-30 LAB — URINE MICROSCOPIC-ADD ON

## 2012-10-30 LAB — PRO B NATRIURETIC PEPTIDE: Pro B Natriuretic peptide (BNP): 5592 pg/mL — ABNORMAL HIGH (ref 0–450)

## 2012-10-30 LAB — URINALYSIS, ROUTINE W REFLEX MICROSCOPIC
Glucose, UA: NEGATIVE mg/dL
Protein, ur: 100 mg/dL — AB

## 2012-10-30 LAB — PROTIME-INR: Prothrombin Time: 18.8 seconds — ABNORMAL HIGH (ref 11.6–15.2)

## 2012-10-30 MED ORDER — HYDROMORPHONE HCL PF 1 MG/ML IJ SOLN
1.0000 mg | Freq: Once | INTRAMUSCULAR | Status: AC
  Administered 2012-10-30: 1 mg via INTRAVENOUS
  Filled 2012-10-30: qty 1

## 2012-10-30 MED ORDER — ONDANSETRON HCL 4 MG PO TABS: 4.0000 mg | ORAL_TABLET | Freq: Four times a day (QID) | ORAL | Status: DC | PRN

## 2012-10-30 MED ORDER — ONDANSETRON HCL 4 MG/2ML IJ SOLN
4.0000 mg | Freq: Once | INTRAMUSCULAR | Status: AC
  Administered 2012-10-30: 4 mg via INTRAVENOUS

## 2012-10-30 MED ORDER — IOHEXOL 300 MG/ML  SOLN
100.0000 mL | Freq: Once | INTRAMUSCULAR | Status: AC | PRN
  Administered 2012-10-30: 100 mL via INTRAVENOUS

## 2012-10-30 MED ORDER — HYDRALAZINE HCL 20 MG/ML IJ SOLN
10.0000 mg | Freq: Four times a day (QID) | INTRAMUSCULAR | Status: DC | PRN
  Administered 2012-10-30 – 2012-10-31 (×2): 10 mg via INTRAVENOUS
  Filled 2012-10-30 (×2): qty 1

## 2012-10-30 MED ORDER — IOHEXOL 300 MG/ML  SOLN
50.0000 mL | Freq: Once | INTRAMUSCULAR | Status: AC | PRN
  Administered 2012-10-30: 50 mL via ORAL

## 2012-10-30 MED ORDER — ONDANSETRON HCL 4 MG/2ML IJ SOLN
INTRAMUSCULAR | Status: AC
  Administered 2012-10-30: 4 mg via INTRAVENOUS
  Filled 2012-10-30: qty 2

## 2012-10-30 MED ORDER — METOPROLOL TARTRATE 1 MG/ML IV SOLN
5.0000 mg | Freq: Four times a day (QID) | INTRAVENOUS | Status: DC
  Administered 2012-10-30: 5 mg via INTRAVENOUS
  Filled 2012-10-30: qty 5

## 2012-10-30 MED ORDER — SODIUM CHLORIDE 0.9 % IV SOLN
INTRAVENOUS | Status: DC
  Administered 2012-10-30: 07:00:00 via INTRAVENOUS

## 2012-10-30 MED ORDER — ONDANSETRON HCL 4 MG/2ML IJ SOLN: 4.0000 mg | Freq: Four times a day (QID) | INTRAMUSCULAR | Status: DC | PRN

## 2012-10-30 MED ORDER — FENTANYL CITRATE 0.05 MG/ML IJ SOLN
50.0000 ug | Freq: Once | INTRAMUSCULAR | Status: AC
  Administered 2012-10-30: via INTRAVENOUS
  Filled 2012-10-30: qty 2

## 2012-10-30 MED ORDER — ENOXAPARIN SODIUM 80 MG/0.8ML ~~LOC~~ SOLN
65.0000 mg | Freq: Two times a day (BID) | SUBCUTANEOUS | Status: DC
  Administered 2012-10-30 – 2012-11-01 (×5): 65 mg via SUBCUTANEOUS
  Filled 2012-10-30 (×7): qty 0.8

## 2012-10-30 MED ORDER — TRAVOPROST (BAK FREE) 0.004 % OP SOLN
1.0000 [drp] | Freq: Every day | OPHTHALMIC | Status: DC
  Administered 2012-10-30 – 2012-11-07 (×9): 1 [drp] via OPHTHALMIC
  Filled 2012-10-30 (×2): qty 2.5

## 2012-10-30 MED ORDER — ENOXAPARIN SODIUM 80 MG/0.8ML ~~LOC~~ SOLN
65.0000 mg | SUBCUTANEOUS | Status: AC
  Administered 2012-10-30: 65 mg via SUBCUTANEOUS
  Filled 2012-10-30: qty 0.8

## 2012-10-30 MED ORDER — IOHEXOL 300 MG/ML  SOLN: 50.0000 mL | Freq: Once | INTRAMUSCULAR | Status: AC | PRN

## 2012-10-30 MED ORDER — FUROSEMIDE 10 MG/ML IJ SOLN
20.0000 mg | Freq: Two times a day (BID) | INTRAMUSCULAR | Status: DC
  Administered 2012-10-30 – 2012-11-02 (×6): 20 mg via INTRAVENOUS
  Filled 2012-10-30 (×6): qty 2

## 2012-10-30 MED ORDER — MORPHINE SULFATE 2 MG/ML IJ SOLN
1.0000 mg | INTRAMUSCULAR | Status: DC | PRN
  Administered 2012-10-30 – 2012-10-31 (×2): 1 mg via INTRAVENOUS
  Filled 2012-10-30 (×2): qty 1

## 2012-10-30 MED ORDER — PANTOPRAZOLE SODIUM 40 MG IV SOLR
40.0000 mg | INTRAVENOUS | Status: DC
  Administered 2012-10-30 – 2012-11-01 (×3): 40 mg via INTRAVENOUS
  Filled 2012-10-30 (×3): qty 40

## 2012-10-30 MED ORDER — HYDROMORPHONE HCL PF 1 MG/ML IJ SOLN
INTRAMUSCULAR | Status: AC
  Administered 2012-10-30: 1 mg
  Filled 2012-10-30: qty 1

## 2012-10-30 MED ORDER — DEXTROSE-NACL 5-0.9 % IV SOLN
INTRAVENOUS | Status: DC
  Administered 2012-10-30: 11:00:00 via INTRAVENOUS

## 2012-10-30 NOTE — Progress Notes (Signed)
At 1930H, pt's blood pressure noted to be in the 160's-170's.  Patient stated that this is not her normal trend.  Patient also denied pain. MD notified, hydralazine 10 mg IV ordered, given at 2040H.  BP rechecked at 2237H, 123/47.  Will continue to evaluate patient.

## 2012-10-30 NOTE — H&P (Signed)
[facility] History and Physical  [name] FAO:[id number] DOB: 08/10/1933 DOA: 10/29/2012  Referring physician: [place] Ramirez, ER physician PCP: Rose Medal, MD  Specialists: None  Chief Complaint: Abdominal pain  HPI: [name] is a 79 y.o. female  With past medical history diastolic heart failure and atrial fibrillation on anticoagulation who on 10/29/12 started having severe right lower quadrant abdominal pain associated with vomiting. Patient had noted some previous episodes of this in the weeks prior, but this time it was persistent and much more severe. She could not improve her pain and should she came into [facility] room for evaluation. Lab work noted a mild leukocytosis, stable creatinine and CT scan noted a small bowel obstruction. She was noted to also have a ventral hernia, however this was independent and unchanged from previous scan. ER physician found this hernia to be reducible. Given these findings, it was felt best that she come in for admission. Cannot hospitalists were contacted and accepted the patient and the patient was transferred to [facility]. Upon initial evaluation given history diastolic heart failure, BNP was checked and found to be elevated at 5600. Patient is asymptomatic of this.  Review of Systems: Patient seen after arrival to floor. Denies any headaches, vision changes, dysphasia, chest pain, palpitations, shortness of breath, wheeze, cough. Some mild abdominal discomfort, but she says this is much better from previous. No current nausea vomiting. Patient denies any constipation or diarrhea. Patient denies any hematuria or dysuria, no focal extremity numbness or weakness or pain. Review systems otherwise negative.  Past Medical History  Diagnosis Date  . Hypertension   . GERD (gastroesophageal reflux disease)   . Glaucoma   . Cataract   . Breast cancer     right  . Valvular heart disease   .  Hyperlipidemia   . Gout   . Shortness of breath   . Heart murmur   . CHF (congestive heart failure)    Past Surgical History  Procedure Laterality Date  . Cardiac valve replacement  2001    per medical history form dated 10/21/10  . Appendectomy  1980's    Per patient son. not sure of date.  . Tubal ligation    . Breast surgery  2012    Right br. Lumpectomy   Social History:  reports that she has never smoked. She has never used smokeless tobacco. She reports that she does not drink alcohol or use illicit drugs. Patient lives at home by herself, but her sons live nearby and check on her. She is able to ambulate pretty well using a walker or a cane  No Known Allergies  Family History  Problem Relation Age of Onset  . Hypertension Mother   . Other Mother     Alzheimers  . Cancer Father     leukemia  . Hypertension Brother   . Other Brother     breathing problems    Prior to Admission medications   Medication Sig Start Date End Date Taking? Authorizing Provider  acetaminophen (TYLENOL) 325 MG tablet Take 325 mg by mouth every 6 (six) hours as needed for pain.   Yes Historical Provider, MD  Calcium Carbonate-Vitamin D (CVS CALCIUM 600 + D PO) Take by mouth 2 (two) times daily.     Yes Historical Provider, MD  diltiazem (CARDIZEM CD) 240 MG 24 hr capsule Take 360 mg by mouth Daily.  03/12/11  Yes Historical Provider, MD  febuxostat (ULORIC) 40 MG  tablet Take 40 mg by mouth daily.   Yes Rose Medal, MD  potassium chloride SA (K-DUR,KLOR-CON) 20 MEQ tablet Take 10 mEq by mouth 2 (two) times daily. 11/23/11 11/22/12 Yes Rhetta Mura, MD  torsemide (DEMADEX) 20 MG tablet Take 40 mg by mouth daily.  03/12/11  Yes Historical Provider, MD  TRAVATAN Z 0.004 % SOLN ophthalmic solution Place 1 drop into both eyes at bedtime.  09/08/11  Yes Historical Provider, MD  warfarin (COUMADIN) 2.5 MG tablet Take 5-7.5 mg by mouth daily. 2 tablets daily except for 3 tablets on Monday,  Wednesday, Friday.   Yes Historical Provider, MD   Physical Exam: Filed Vitals:   10/29/12 2343 10/30/12 0412 10/30/12 0553 10/30/12 0900  BP: 183/72 159/73 155/85 136/76  Pulse: 84 62 72 65  Temp: 97.9 F (36.6 C) 98 F (36.7 C) 98.1 F (36.7 C) 97.5 F (36.4 C)  TempSrc: Axillary  Oral Oral  Resp:  18 18 18   Height: 5\' 2"  (1.575 m)  5\' 2"  (1.575 m)   Weight: 69.854 kg (154 lb)  66.906 kg (147 lb 8 oz)   SpO2: 98% 94% 95% 97%     General:  Alert and oriented x3, no acute distress, looks about stated age  Eyes: Sclera nonicteric, extraocular movements are intact  ENT: Normocephalic, atraumatic, mucous membranes are slightly dry  Neck: Supple, no JVD  Cardiovascular: Soft, currently regular rhythm with occasional ectopic beat, 2/6 systolic ejection murmur  Respiratory: Clear to auscultation bilaterally  Abdomen: Soft, nontender, distended, few high-pitched bowel sounds, reducible ventral hernia  Skin: No skin breaks, tears or lesions  Musculoskeletal: No clubbing or cyanosis, trace edema  Psychiatric: Patient is appropriate, no evidence of psychoses  Neurologic: No focal deficits  Labs on Admission:  Basic Metabolic Panel:  Recent Labs Lab 10/30/12 0024  NA 142  K 3.7  CL 102  CO2 27  GLUCOSE 136*  BUN 27*  CREATININE 1.20*  CALCIUM 10.3   Liver Function Tests:  Recent Labs Lab 10/30/12 0024  AST 23  ALT 16  ALKPHOS 167*  BILITOT 0.7  PROT 8.8*  ALBUMIN 4.0    Recent Labs Lab 10/30/12 0024  LIPASE 18   CBC:  Recent Labs Lab 10/30/12 0024  WBC 12.0*  NEUTROABS 10.9*  HGB 13.5  HCT 40.2  MCV 90.1  PLT 141*    BNP (last 3 results)  Recent Labs  10/31/12  PROBNP 5592.0*    Radiological Exams on Admission: Ct Abdomen Pelvis W Contrast  10/30/2012     IMPRESSION: Fluid-filled distended small bowel with transition zone in the pelvis consistent with small bowel obstruction.  Small amount of ascites.  Stable appearance of hepatic  and renal cysts.  Stable appearance of anterior abdominal wall hernias.   Original Report Authenticated By: Burman Nieves, M.D.     Assessment/Plan Principal Problem:   SBO (small bowel obstruction): Secondary to adhesions from previous surgeries. N.p.o. plus nausea control. Repeat x-rays. Stable otherwise. No reason for NG tube at this time. This is independent from her ventral hernia. Active Problems:   Breast cancer: Stable.    Acute on Chronic diastolic heart failure: BNP ordered moderately elevated at 5600. Some of this may be attributed to patient's slightly elevated creatinine, but likely she has some volume overload. We'll start IV diuretics.    Atrial fibrillation: Rate controlled.    Chronic anticoagulation: Secondary to atrial fibrillation. Coumadin on hold. Pharmacy to dose Lovenox for full anticoagulation.  Chronic kidney disease, stage 3, mod decreased GFR: Stable. Creatinine is actually better than it has been based on previous labs.    Hypertension: Stable. The beta blocker on hold. Patient became quite bradycardic with IV Lopressor. We'll only give if heart rate above 100 her blood pressure markedly elevated.   Ventral hernia: Currently reducible. Have advised patient that she will need to followup with Gen. surgery as outpatient for evaluation of surgical repair. We'll give referral.    Code Status: Full code for now. Patient states that she's not really thought about before and will discuss this with her son.  Family Communication: Plan discussed with patient and her son at the bedside. Disposition Plan: Home once bowel obstruction resolved.  Time spent: 25 minutes  Hollice Espy [facility] Pager [phone]  If 7PM-7AM, please contact night-coverage [email or web address] Password [facility] 10/30/2012, 10:55 AM

## 2012-10-30 NOTE — Progress Notes (Signed)
Pt had a 12 beat run of VTACH asymptomatic lying in bed. MD notified. No new orders received.

## 2012-10-30 NOTE — Progress Notes (Addendum)
Received from [facility] with complaints of abdominal pain and nausea.  Patient arrived via stretcher, on O2 support, accompanied by EMS.  Oriented patient to unit policies and routines.  Instructed to call for concerns.  Admitting doctor Lovell Sheehan paged and informed of this admission.  IV fluids started, instructed to be on NPO as ordered. Will continue to evaluate pt.

## 2012-10-30 NOTE — ED Provider Notes (Signed)
History    CSN: [id number] Arrival date & time 10/29/12  2340  First MD Initiated Contact with Patient 10/29/12 2359     Chief Complaint  Patient presents with  . Abdominal Pain   (Consider location/radiation/quality/duration/timing/severity/associated sxs/prior Treatment) Patient is a 79 y.o. female presenting with abdominal pain.  Abdominal Pain Associated symptoms include abdominal pain.   Pt with history of atrial fibrillation was in her normal state of health until a few hours ago when she began to have severe aching RLQ abdominal pain, associated with vomiting. She has had mild pain in that area for a few weeks, but significantly worse today. Denies any problems with BM, no dysuria. No prior history of same. Has had appendectomy and tubal ligation.  Past Medical History  Diagnosis Date  . Hypertension   . GERD (gastroesophageal reflux disease)   . Glaucoma   . Cataract   . Breast cancer     right  . Valvular heart disease   . Hyperlipidemia   . Gout    Past Surgical History  Procedure Laterality Date  . Cardiac valve replacement  2001    per medical history form dated 10/21/10  . Appendectomy  1980's    Per patient son. not sure of date.  . Tubal ligation    . Breast surgery  2012    Right br. Lumpectomy   Family History  Problem Relation Age of Onset  . Hypertension Mother   . Other Mother     Alzheimers  . Cancer Father     leukemia  . Hypertension Brother   . Other Brother     breathing problems   History  Substance Use Topics  . Smoking status: Never Smoker   . Smokeless tobacco: Never Used  . Alcohol Use: No   OB History   Grav Para Term Preterm Abortions TAB SAB Ect Mult Living                 Review of Systems  Gastrointestinal: Positive for abdominal pain.   All other systems reviewed and are negative except as noted in HPI.   Allergies  Review of patient's allergies indicates no known allergies.  Home Medications   Current  Outpatient Rx  Name  Route  Sig  Dispense  Refill  . acetaminophen (TYLENOL) 325 MG tablet   Oral   Take 325 mg by mouth every 6 (six) hours as needed for pain.         . Calcium Carbonate-Vitamin D ([employer] CALCIUM 600 + D PO)   Oral   Take by mouth 2 (two) times daily.           Marland Kitchen diltiazem (CARDIZEM CD) 240 MG 24 hr capsule   Oral   Take 360 mg by mouth Daily.          . febuxostat (ULORIC) 40 MG tablet   Oral   Take 40 mg by mouth daily.         . potassium chloride SA (K-DUR,KLOR-CON) 20 MEQ tablet   Oral   Take 10 mEq by mouth 2 (two) times daily.         Marland Kitchen torsemide (DEMADEX) 20 MG tablet   Oral   Take 40 mg by mouth daily.          . TRAVATAN Z 0.004 % SOLN ophthalmic solution   Both Eyes   Place 1 drop into both eyes at bedtime.          Marland Kitchen  warfarin (COUMADIN) 2.5 MG tablet   Oral   Take 5-7.5 mg by mouth daily. 2 tablets daily except for 3 tablets on Monday, Wednesday, Friday.          BP 183/72  Pulse 84  Temp(Src) 97.9 F (36.6 C) (Axillary)  Ht 5\' 2"  (1.575 m)  Wt 154 lb (69.854 kg)  BMI 28.16 kg/m2  SpO2 98% Physical Exam  Nursing note and vitals reviewed. Constitutional: She is oriented to person, place, and time. She appears well-developed and well-nourished.  HENT:  Head: Normocephalic and atraumatic.  Eyes: EOM are normal. Pupils are equal, round, and reactive to light.  Neck: Normal range of motion. Neck supple.  Cardiovascular: Normal rate, normal heart sounds and intact distal pulses.   Pulmonary/Chest: Effort normal and breath sounds normal.  Abdominal: Soft. Bowel sounds are normal. She exhibits mass (incarcerated hernia in RLQ lateral to midline incision from previous laparotomy). She exhibits no distension. There is tenderness (RLQ). There is no rebound and no guarding.  Musculoskeletal: Normal range of motion. She exhibits no edema and no tenderness.  Neurological: She is alert and oriented to person, place, and time. She has  normal strength. No cranial nerve deficit or sensory deficit.  Skin: Skin is warm and dry. No rash noted.  Psychiatric: She has a normal mood and affect.    ED Course  Procedures (including critical care time) Labs Reviewed  CBC WITH DIFFERENTIAL - Abnormal; Notable for the following:    WBC 12.0 (*)    RDW 17.5 (*)    Platelets 141 (*)    Neutrophils Relative % 91 (*)    Neutro Abs 10.9 (*)    Lymphocytes Relative 5 (*)    Lymphs Abs 0.6 (*)    All other components within normal limits  COMPREHENSIVE METABOLIC PANEL - Abnormal; Notable for the following:    Glucose, Bld 136 (*)    BUN 27 (*)    Creatinine, Ser 1.20 (*)    Total Protein 8.8 (*)    Alkaline Phosphatase 167 (*)    GFR calc non Af Amer 43 (*)    GFR calc Af Amer 50 (*)    All other components within normal limits  URINALYSIS, ROUTINE W REFLEX MICROSCOPIC - Abnormal; Notable for the following:    Hgb urine dipstick MODERATE (*)    Protein, ur 100 (*)    All other components within normal limits  PROTIME-INR - Abnormal; Notable for the following:    Prothrombin Time 18.8 (*)    INR 1.62 (*)    All other components within normal limits  LIPASE, BLOOD  URINE MICROSCOPIC-ADD ON   Ct Abdomen Pelvis W Contrast  10/30/2012   *RADIOLOGY REPORT*  Clinical Data: Abdominal pain for several weeks with sudden onset of lower abdominal cramping tonight. History of umbilical and right lower quadrant hernias.  CT ABDOMEN AND PELVIS WITH CONTRAST  Technique:  Multidetector CT imaging of the abdomen and pelvis was performed following the standard protocol during bolus administration of intravenous contrast.  Contrast: OMNIPAQUE IOHEXOL 300 MG/ML  SOLN  Comparison: 12/19/2010  Findings: Atelectasis or infiltration in both lung bases.  Small esophageal hiatal hernia with contrast material in the distal esophagus suggesting reflux or dysmotility.  Filling defect in the lower esophagus could represent a polyp or ingested material.  Diffuse cardiac enlargement.  Postoperative change in the mediastinum.  Reversed flow of contrast material from the heart into the hepatic veins consistent with passive hepatic congestion. Multiple  hepatic cysts, largest in the lateral segment left lobe measuring 4 cm diameter and stable since previous study.  Large stone in the gallbladder.  No gallbladder wall thickening.  No bile duct dilatation.  The spleen, pancreas, adrenal glands, and inferior vena cava are unremarkable.  Nonobstructing stones in the lower pole of the right kidney.  Bilateral renal cysts are stable.  No retroperitoneal lymphadenopathy.  Small amount of fluid in the upper abdomen around the liver and spleen.  Density measurements are consistent with ascites.  Right lower quadrant anterior abdominal wall hernia to the right of midline at the level of the umbilicus containing fluid and fatty infiltration suggesting fat necrosis.  Minimal fat containing umbilical hernia.  The hernia is appear stable since the previous study.  There is proximal small bowel distension with fluid filled loops.  The distal small bowel is decompressed.  Changes are consistent with small bowel obstruction.  Transition zone appears to be in the mid pelvis. Suggestion of mild wall thickening at the level of This does not appear to be associated with the hernia and probably represents adhesions. Inflammatory bowel disease is not excluded.  Stool filled colon without distension or wall thickening.  Pelvis:  Small amount of free fluid in the pelvis.  Uterus and ovaries are not enlarged.  Bladder wall is not thickened.  No significant lymphadenopathy in the pelvis.  Appendix is surgically absent by history.  Degenerative changes throughout the spine with mild scoliosis.  Compression of T11 and T12 are stable.  IMPRESSION: Fluid-filled distended small bowel with transition zone in the pelvis consistent with small bowel obstruction.  Small amount of ascites.  Stable appearance  of hepatic and renal cysts.  Stable appearance of anterior abdominal wall hernias.   Original Report Authenticated By: Burman Nieves, M.D.   1. Small bowel obstruction     MDM  Unable to reduce hernia on initial attempt, will give pain meds, placed in trendelenburg and re-attempt.   2:04 AM Unable to reduce hernia on second attempt. Will send for CT to further evaluate. Labs reviewed.   3:49 AM CT images reviewed, hernia is fat containing only, but SBO likely from adhesions in pelvis. No active vomiting so will hold off on NG Tube. Admit for further management.   Charles B. Bernette Mayers, MD 10/30/12 5621

## 2012-10-30 NOTE — Progress Notes (Signed)
Pts HR dropping into the 30s lowest 37 pt asymptomatic sleeping in bed comfortably , MD made aware discontinued medication.

## 2012-10-30 NOTE — Progress Notes (Signed)
ANTICOAGULATION CONSULT NOTE - Initial Consult  Pharmacy Consult for lovenox Indication: atrial fibrillation  No Known Allergies  Patient Measurements: Height: 5\' 2"  (157.5 cm) Weight: 147 lb 8 oz (66.906 kg) (b scale) IBW/kg (Calculated) : 50.1   Vital Signs: Temp: 97.5 F (36.4 C) (07/05 0900) Temp src: Oral (07/05 0900) BP: 136/76 mmHg (07/05 0900) Pulse Rate: 65 (07/05 0900)  Labs:  Recent Labs  10/30/12 0024  HGB 13.5  HCT 40.2  PLT 141*  LABPROT 18.8*  INR 1.62*  CREATININE 1.20*    Estimated Creatinine Clearance: 35.8 ml/min (by C-G formula based on Cr of 1.2).   Medical History: Past Medical History  Diagnosis Date  . Hypertension   . GERD (gastroesophageal reflux disease)   . Glaucoma   . Cataract   . Breast cancer     right  . Valvular heart disease   . Hyperlipidemia   . Gout   . Shortness of breath   . Heart murmur   . CHF (congestive heart failure)     Assessment: Patient is a 79 y.o F transferred to [facility] from [facility] with c/o abdominal pain and n/v. Patient is on coumadin PTA for afib and "heart valve repair." Home dose is 7.5mg  daily with last dose taken on 7/04 (per patient).  INR is 1.62.  To start lovenox for subtherapeutic INR. Scr 1.20 (crcl~ 36)  Goal of Therapy:  Anti-Xa level 0.6-1.2 units/ml 4hrs after LMWH dose given Monitor platelets by anticoagulation protocol: Yes   Plan:  1) lovenox 65 mg SQ q12h 2) watch renal function closely and adjust dose as needed   Edna Grover P 10/30/2012,11:44 AM

## 2012-10-31 ENCOUNTER — Inpatient Hospital Stay (HOSPITAL_COMMUNITY): Payer: Medicare Other

## 2012-10-31 DIAGNOSIS — I10 Essential (primary) hypertension: Secondary | ICD-10-CM

## 2012-10-31 LAB — CBC
MCH: 29.3 pg (ref 26.0–34.0)
Platelets: 141 10*3/uL — ABNORMAL LOW (ref 150–400)
RBC: 3.99 MIL/uL (ref 3.87–5.11)
RDW: 17.4 % — ABNORMAL HIGH (ref 11.5–15.5)
WBC: 8.5 10*3/uL (ref 4.0–10.5)

## 2012-10-31 LAB — BASIC METABOLIC PANEL
Calcium: 8.8 mg/dL (ref 8.4–10.5)
GFR calc non Af Amer: 42 mL/min — ABNORMAL LOW (ref >90)
Glucose, Bld: 78 mg/dL (ref 70–99)
Sodium: 144 mEq/L (ref 135–145)

## 2012-10-31 MED ORDER — DILTIAZEM HCL ER COATED BEADS 360 MG PO CP24
360.0000 mg | ORAL_CAPSULE | Freq: Every day | ORAL | Status: DC
  Administered 2012-10-31 – 2012-11-06 (×7): 360 mg via ORAL
  Filled 2012-10-31 (×7): qty 1

## 2012-10-31 MED ORDER — MORPHINE SULFATE 2 MG/ML IJ SOLN: 0.5000 mg | INTRAMUSCULAR | Status: DC | PRN

## 2012-10-31 NOTE — Progress Notes (Signed)
[facility] PROGRESS NOTE  [name] WJX:[id number] DOB: 1934-04-22 DOA: 10/29/2012 PCP: Henri Medal, MD  Assessment/Plan: #1 small bowel obstruction Questionable etiology. Patient with prior surgeries. Clinical improvement. Abdominal x-rays show resolution of small bowel obstruction with contrast in the rectum. Will place patient on clear liquids. Follow.  #2 acute on chronic diastolic CHF BNP was moderately elevated on admission at 5600. I/O = -1500. Patient asymptomatic. Continue IV Lasix. Follow.  #3 atrial fibrillation Resume Cardizem for rate control. Continue full dose Lovenox to anticoagulate him for now. If patient shows continued improvement will resume Coumadin tomorrow.  #4 hypertension Stable. Restart Cardizem.  #5 chronic kidney disease stage III Stable.  #6 history of breast cancer Stable. In remission. Followup as outpatient.  #7 ventral hernia Patient to followup with general surgery as outpatient.  #8 prophylaxis PPI for GI prophylaxis. Lovenox for DVT prophylaxis.  Code Status: Full Family Communication: Updated patient no family at bedside. Disposition Plan: Home when medically stable.   Consultants:  None  Procedures:  CT abdomen and pelvis 10/31/2038  Abdominal x-ray 10/31/2012  Antibiotics:  None  HPI/Subjective: Patient states had abdominal pain early this morning however has since resolved. No further abdominal pain. No emesis. Patient denies any flatness. No bowel movement. Patient states she's feeling better.  Objective: Filed Vitals:   10/30/12 2237 10/31/12 0540 10/31/12 0648 10/31/12 0900  BP: 123/47 177/76 138/59 159/61  Pulse:  69 70 76  Temp:  97.7 F (36.5 C)  97.2 F (36.2 C)  TempSrc:  Oral  Oral  Resp:    18  Height:      Weight:  65.8 kg (145 lb 1 oz)    SpO2:  99% 99% 100%    Intake/Output Summary (Last 24 hours) at 10/31/12 1001 Last data filed at 10/31/12 0700  Gross per 24 hour  Intake       0 ml  Output   1500 ml  Net  -1500 ml   Filed Weights   10/29/12 2343 10/30/12 0553 10/31/12 0540  Weight: 69.854 kg (154 lb) 66.906 kg (147 lb 8 oz) 65.8 kg (145 lb 1 oz)    Exam:   General:  NAD  Cardiovascular: Irregularly irregular  Respiratory: CTAB  Abdomen: Soft/NT/ND/+BS. Reducible ventral hernia  Extremities: No c/c/e  Data Reviewed: Basic Metabolic Panel:  Recent Labs Lab 10/30/12 0024 10/31/12 0610  NA 142 144  K 3.7 3.9  CL 102 109  CO2 27 24  GLUCOSE 136* 78  BUN 27* 23  CREATININE 1.20* 1.22*  CALCIUM 10.3 8.8   Liver Function Tests:  Recent Labs Lab 10/30/12 0024  AST 23  ALT 16  ALKPHOS 167*  BILITOT 0.7  PROT 8.8*  ALBUMIN 4.0    Recent Labs Lab 10/30/12 0024  LIPASE 18   No results found for this basename: AMMONIA,  in the last 168 hours CBC:  Recent Labs Lab 10/30/12 0024 10/31/12 0610  WBC 12.0* 8.5  NEUTROABS 10.9*  --   HGB 13.5 11.7*  HCT 40.2 36.2  MCV 90.1 90.7  PLT 141* 141*   Cardiac Enzymes: No results found for this basename: CKTOTAL, CKMB, CKMBINDEX, TROPONINI,  in the last 168 hours BNP (last 3 results)  Recent Labs  11/21/11 2315 10/30/12 1130  PROBNP 7408.0* 5592.0*   CBG: No results found for this basename: GLUCAP,  in the last 168 hours  No results found for this or any previous visit (from the past 240 hour(s)).   Studies:  Dg Abd 1 View  10/31/2012   *RADIOLOGY REPORT*  Clinical Data: Follow up small bowel obstruction  ABDOMEN - 1 VIEW  Comparison: 10/31/2012  Findings: Enteric contrast material is again noted within the left side of colon and rectum.  No dilated small bowel loops or air fluid levels identified.  IMPRESSION:  1.  Stable bowel gas pattern.  No obstructive features identified.   Original Report Authenticated By: Signa Kell, M.D.   Ct Abdomen Pelvis W Contrast  10/30/2012   *RADIOLOGY REPORT*  Clinical Data: Abdominal pain for several weeks with sudden onset of lower  abdominal cramping tonight. History of umbilical and right lower quadrant hernias.  CT ABDOMEN AND PELVIS WITH CONTRAST  Technique:  Multidetector CT imaging of the abdomen and pelvis was performed following the standard protocol during bolus administration of intravenous contrast.  Contrast: OMNIPAQUE IOHEXOL 300 MG/ML  SOLN  Comparison: 12/19/2010  Findings: Atelectasis or infiltration in both lung bases.  Small esophageal hiatal hernia with contrast material in the distal esophagus suggesting reflux or dysmotility.  Filling defect in the lower esophagus could represent a polyp or ingested material. Diffuse cardiac enlargement.  Postoperative change in the mediastinum.  Reversed flow of contrast material from the heart into the hepatic veins consistent with passive hepatic congestion. Multiple hepatic cysts, largest in the lateral segment left lobe measuring 4 cm diameter and stable since previous study.  Large stone in the gallbladder.  No gallbladder wall thickening.  No bile duct dilatation.  The spleen, pancreas, adrenal glands, and inferior vena cava are unremarkable.  Nonobstructing stones in the lower pole of the right kidney.  Bilateral renal cysts are stable.  No retroperitoneal lymphadenopathy.  Small amount of fluid in the upper abdomen around the liver and spleen.  Density measurements are consistent with ascites.  Right lower quadrant anterior abdominal wall hernia to the right of midline at the level of the umbilicus containing fluid and fatty infiltration suggesting fat necrosis.  Minimal fat containing umbilical hernia.  The hernia is appear stable since the previous study.  There is proximal small bowel distension with fluid filled loops.  The distal small bowel is decompressed.  Changes are consistent with small bowel obstruction.  Transition zone appears to be in the mid pelvis. Suggestion of mild wall thickening at the level of This does not appear to be associated with the hernia and  probably represents adhesions. Inflammatory bowel disease is not excluded.  Stool filled colon without distension or wall thickening.  Pelvis:  Small amount of free fluid in the pelvis.  Uterus and ovaries are not enlarged.  Bladder wall is not thickened.  No significant lymphadenopathy in the pelvis.  Appendix is surgically absent by history.  Degenerative changes throughout the spine with mild scoliosis.  Compression of T11 and T12 are stable.  IMPRESSION: Fluid-filled distended small bowel with transition zone in the pelvis consistent with small bowel obstruction.  Small amount of ascites.  Stable appearance of hepatic and renal cysts.  Stable appearance of anterior abdominal wall hernias.   Original Report Authenticated By: Burman Nieves, M.D.   Dg Abd Portable 1v  10/31/2012   *RADIOLOGY REPORT*  Clinical Data: Follow up small bowel obstruction  PORTABLE ABDOMEN - 1 VIEW  Comparison: CT abdomen/pelvis 10/30/2012  Findings: Interval resolution of small bowel obstruction.  No dilated loops of small bowel are identified.  There is been distal progression of oral contrast material into the colon all the way to the rectum.  Marked  rotary dextroconvex scoliosis of the lumbar spine.  Cholelithiasis.  Atelectasis versus scarring in the left lower lobe.  IMPRESSION: Interval resolution of small bowel obstruction with progression of oral contrast material into the colon and rectum.   Original Report Authenticated By: Malachy Moan, M.D.    Scheduled Meds: . diltiazem  360 mg Oral Daily  . enoxaparin (LOVENOX) injection  65 mg Subcutaneous Q12H  . furosemide  20 mg Intravenous BID  . pantoprazole (PROTONIX) IV  40 mg Intravenous Q24H  . Travoprost (BAK Free)  1 drop Both Eyes QHS   Continuous Infusions:   Principal Problem:   SBO (small bowel obstruction) Active Problems:   Breast cancer   Chronic diastolic heart failure   Atrial fibrillation   Chronic anticoagulation   Chronic kidney disease,  stage 3, mod decreased GFR   Hypertension   Ventral hernia   Acute diastolic heart failure    Time spent: > 35 mins    [facility]  [facility] Pager [phone]. If 7PM-7AM, please contact night-coverage at [email or web address], password [facility] 10/31/2012, 10:01 AM  LOS: 2 days

## 2012-10-31 NOTE — Progress Notes (Signed)
Pt ambulated in hallway with front wheelwalke on RA . Denies any SOB or pain . Pt states she has not passed any flatulence.

## 2012-11-01 LAB — BASIC METABOLIC PANEL
CO2: 27 mEq/L (ref 19–32)
Calcium: 8.4 mg/dL (ref 8.4–10.5)
GFR calc Af Amer: 53 mL/min — ABNORMAL LOW (ref >90)
Sodium: 140 mEq/L (ref 135–145)

## 2012-11-01 LAB — CBC
MCV: 89.7 fL (ref 78.0–100.0)
Platelets: 127 10*3/uL — ABNORMAL LOW (ref 150–400)
RBC: 3.51 MIL/uL — ABNORMAL LOW (ref 3.87–5.11)
WBC: 7.7 10*3/uL (ref 4.0–10.5)

## 2012-11-01 LAB — PRO B NATRIURETIC PEPTIDE: Pro B Natriuretic peptide (BNP): 3886 pg/mL — ABNORMAL HIGH (ref 0–450)

## 2012-11-01 MED ORDER — POTASSIUM CHLORIDE CRYS ER 10 MEQ PO TBCR
10.0000 meq | EXTENDED_RELEASE_TABLET | Freq: Two times a day (BID) | ORAL | Status: DC
  Administered 2012-11-01 – 2012-11-08 (×14): 10 meq via ORAL
  Filled 2012-11-01 (×15): qty 1

## 2012-11-01 MED ORDER — FEBUXOSTAT 40 MG PO TABS
40.0000 mg | ORAL_TABLET | Freq: Every day | ORAL | Status: DC
  Administered 2012-11-01 – 2012-11-08 (×8): 40 mg via ORAL
  Filled 2012-11-01 (×8): qty 1

## 2012-11-01 MED ORDER — WARFARIN SODIUM 10 MG PO TABS
10.0000 mg | ORAL_TABLET | Freq: Once | ORAL | Status: AC
  Administered 2012-11-01: 10 mg via ORAL
  Filled 2012-11-01: qty 1

## 2012-11-01 MED ORDER — WARFARIN - PHARMACIST DOSING INPATIENT
Freq: Every day | Status: DC
  Administered 2012-11-03 – 2012-11-08 (×4)

## 2012-11-01 MED ORDER — PANTOPRAZOLE SODIUM 40 MG PO TBEC
40.0000 mg | DELAYED_RELEASE_TABLET | Freq: Every day | ORAL | Status: DC
  Administered 2012-11-02 – 2012-11-08 (×7): 40 mg via ORAL
  Filled 2012-11-01 (×6): qty 1

## 2012-11-01 NOTE — Evaluation (Signed)
Occupational Therapy Evaluation and Discharge Patient Details Name: [name] MRN: [id number] DOB: 02/07/1934 Today's Date: 11/01/2012 Time: 0912-0927 OT Time Calculation (min): 15 min  OT Assessment / Plan / Recommendation History of present illness Abdominal pain. With past medical history diastolic heart failure and atrial fibrillation on anticoagulation who on 10/29/12 started having severe right lower quadrant abdominal pain associated with vomiting. Patient had noted some previous episodes of this in the weeks prior, but this time it was persistent and much more severe. She could not improve her pain and should she came [facility] for evaluation. Lab work noted a mild leukocytosis, stable creatinine and CT scan noted a small bowel obstruction. She was noted to also have a ventral hernia, however this was independent and unchanged from previous scan. ER physician found this hernia to be reducible. Given these findings, it was felt best that she come in for admission. Cannot hospitalists were contacted and accepted the patient and the patient was transferred to [facility]. Upon initial evaluation given history diastolic heart failure, BNP was checked and found to be elevated at 5600. Patient is asymptomatic of this.   Clinical Impression   This pt presents to acute OT at baseline level of functioning with intermittent S by family. No further OT needs, will sign off.   OT Assessment  Patient does not need any further OT services    Follow Up Recommendations  No OT follow up       Equipment Recommendations  None recommended by OT          Precautions / Restrictions Precautions Precautions: None Restrictions Weight Bearing Restrictions: No       ADL  Equipment Used: Rolling walker Transfers/Ambulation Related to ADLs: S for all with RW ADL Comments: S for all        Visit Information  Last OT Received On: 11/01/12 Assistance  Needed: +1 PT/OT Co-Evaluation/Treatment: Yes History of Present Illness: Abdominal pain. With past medical history diastolic heart failure and atrial fibrillation on anticoagulation who on 10/29/12 started having severe right lower quadrant abdominal pain associated with vomiting. Patient had noted some previous episodes of this in the weeks prior, but this time it was persistent and much more severe. She could not improve her pain and should she came into [facility] room for evaluation. Lab work noted a mild leukocytosis, stable creatinine and CT scan noted a small bowel obstruction. She was noted to also have a ventral hernia, however this was independent and unchanged from previous scan. ER physician found this hernia to be reducible. Given these findings, it was felt best that she come in for admission. Cannot hospitalists were contacted and accepted the patient and the patient was transferred to [facility]. Upon initial evaluation given history diastolic heart failure, BNP was checked and found to be elevated at 5600. Patient is asymptomatic of this.       Prior Functioning     Home Living Family/patient expects to be discharged to:: Private residence Living Arrangements: Alone Available Help at Discharge: Available PRN/intermittently Type of Home: House Home Access: Ramped entrance Home Layout: One level Home Equipment: Bedside commode;Walker - 4 wheels;Cane - single point Prior Function Level of Independence: Independent with assistive device(s) Communication Communication: No difficulties Dominant Hand: Right         Vision/Perception Vision - History Baseline Vision: Wears glasses only for reading Patient Visual Report: No change from baseline  Cognition  Cognition Arousal/Alertness: Awake/alert Behavior During Therapy: WFL for tasks assessed/performed Overall Cognitive Status: Within Functional Limits for tasks assessed    Extremity/Trunk  Assessment Upper Extremity Assessment Upper Extremity Assessment: Overall WFL for tasks assessed Lower Extremity Assessment Lower Extremity Assessment: Overall WFL for tasks assessed     Mobility Bed Mobility Details for Bed Mobility Assistance: Pt up in recliner upon arrival Transfers Sit to Stand: 5: Supervision;With armrests;From chair/3-in-1 Stand to Sit: 5: Supervision;To chair/3-in-1;With upper extremity assist           End of Session OT - End of Session Equipment Utilized During Treatment: Rolling walker Activity Tolerance: Patient tolerated treatment well Patient left: in chair;with call bell/phone within reach Nurse Communication: Mobility status       Evette Georges [phone] 11/01/2012, 9:40 AM

## 2012-11-01 NOTE — Evaluation (Signed)
Physical Therapy Evaluation Patient Details Name: [name] MRN: [id number] DOB: 1934-03-24 Today's Date: 11/01/2012 Time: 4098-1191 PT Time Calculation (min): 15 min  PT Assessment / Plan / Recommendation History of Present Illness  Abdominal pain. With past medical history diastolic heart failure and atrial fibrillation on anticoagulation who on 10/29/12 started having severe right lower quadrant abdominal pain associated with vomiting. Patient had noted some previous episodes of this in the weeks prior, but this time it was persistent and much more severe. She could not improve her pain and should she came into [facility] room for evaluation. Lab work noted a mild leukocytosis, stable creatinine and CT scan noted a small bowel obstruction. She was noted to also have a ventral hernia, however this was independent and unchanged from previous scan. ER physician found this hernia to be reducible. Given these findings, it was felt best that she come in for admission. Cannot hospitalists were contacted and accepted the patient and the patient was transferred to [facility]. Upon initial evaluation given history diastolic heart failure, BNP was checked and found to be elevated at 5600. Patient is asymptomatic of this.  Clinical Impression  Pt tolerating ambulation. Has amb with nursing. Pt requires no further PT at this time.    PT Assessment  Patent does not need any further PT services    Follow Up Recommendations  No PT follow up    Does the patient have the potential to tolerate intense rehabilitation      Barriers to Discharge        Equipment Recommendations  None recommended by PT    Recommendations for Other Services     Frequency      Precautions / Restrictions Precautions Precautions: None Restrictions Weight Bearing Restrictions: No   Pertinent Vitals/Pain No c/o      Mobility  Transfers Transfers: Sit to Stand;Stand to Sit Sit to  Stand: 5: Supervision;With armrests;From chair/3-in-1 Stand to Sit: 5: Supervision;To chair/3-in-1;With upper extremity assist Ambulation/Gait Ambulation/Gait Assistance: 5: Supervision Ambulation Distance (Feet): 160 Feet Assistive device: Rolling walker Ambulation/Gait Assistance Details: pt guides RW well.Pt is used to a 4 wheeled. Gait Pattern: Step-through pattern    Exercises     PT Diagnosis:    PT Problem List:   PT Treatment Interventions:       PT Goals(Current goals can be found in the care plan section)    Visit Information  Last PT Received On: 11/01/12 Assistance Needed: +1 History of Present Illness: Abdominal pain. With past medical history diastolic heart failure and atrial fibrillation on anticoagulation who on 10/29/12 started having severe right lower quadrant abdominal pain associated with vomiting. Patient had noted some previous episodes of this in the weeks prior, but this time it was persistent and much more severe. She could not improve her pain and should she came into [facility] for evaluation. Lab work noted a mild leukocytosis, stable creatinine and CT scan noted a small bowel obstruction. She was noted to also have a ventral hernia, however this was independent and unchanged from previous scan. ER physician found this hernia to be reducible. Given these findings, it was felt best that she come in for admission. Cannot hospitalists were contacted and accepted the patient and the patient was transferred to [facility]. Upon initial evaluation given history diastolic heart failure, BNP was checked and found to be elevated at 5600. Patient is asymptomatic of this.  Prior Functioning  Home Living Family/patient expects to be discharged to:: Private residence Living Arrangements: Alone Available Help at Discharge: Available PRN/intermittently Type of Home: House Home Access: Ramped entrance Home Layout: One level Home  Equipment: Bedside commode;Walker - 4 wheels;Cane - single point Prior Function Level of Independence: Independent with assistive device(s) Communication Communication: No difficulties Dominant Hand: Right    Cognition  Cognition Arousal/Alertness: Awake/alert Behavior During Therapy: WFL for tasks assessed/performed Overall Cognitive Status: Within Functional Limits for tasks assessed    Extremity/Trunk Assessment Upper Extremity Assessment Upper Extremity Assessment: Overall WFL for tasks assessed Lower Extremity Assessment Lower Extremity Assessment: Overall WFL for tasks assessed   Balance    End of Session PT - End of Session Activity Tolerance: Patient tolerated treatment well Patient left: in chair;with call bell/phone within reach Nurse Communication: Mobility status  GP     Rada Hay 11/01/2012, 9:38 AM Blanchard Kelch PT [phone]

## 2012-11-01 NOTE — Progress Notes (Signed)
[facility] PROGRESS NOTE  [name]:[id number] DOB: 1934-02-18 DOA: 10/29/2012 PCP: Henri Medal, MD  Assessment/Plan: #1 small bowel obstruction Questionable etiology. Patient with prior surgeries. Clinical improvement. Abdominal x-rays show resolution of small bowel obstruction with contrast in the rectum. Patient with BM today. Advance diet to solids.  #2 acute on chronic diastolic CHF BNP was moderately elevated on admission at 5600. I/O = -2015. Check 2 d echo. Patient asymptomatic. Continue IV Lasix. Follow.  #3 atrial fibrillation Cardizem for rate control. Continue full dose Lovenox to anticoagulate him for now. Resume coumadin.   #4 hypertension Stable. Restart Cardizem.  #5 chronic kidney disease stage III Stable.  #6 history of breast cancer Stable. In remission. Followup as outpatient.  #7 ventral hernia Patient to followup with general surgery as outpatient.  #8 prophylaxis PPI for GI prophylaxis. Lovenox for DVT prophylaxis.  Code Status: Full Family Communication: Updated patient no family at bedside. Disposition Plan: Home when medically stable.   Consultants:  None  Procedures:  CT abdomen and pelvis 10/31/2038  Abdominal x-ray 10/31/2012  Antibiotics:  None  HPI/Subjective: Patient states abdominal pain improved. Patient with BM this morning.  Objective: Filed Vitals:   10/31/12 0900 10/31/12 1355 10/31/12 2100 11/01/12 0520  BP: 159/61 156/95 152/85 104/78  Pulse: 76 93 77 72  Temp: 97.2 F (36.2 C) 98.7 F (37.1 C) 98 F (36.7 C) 97.9 F (36.6 C)  TempSrc: Oral Oral Oral Oral  Resp: 18 20 20 18   Height:      Weight:    66.134 kg (145 lb 12.8 oz)  SpO2: 100% 100% 100% 100%    Intake/Output Summary (Last 24 hours) at 11/01/12 1100 Last data filed at 11/01/12 0908  Gross per 24 hour  Intake    960 ml  Output    975 ml  Net    -15 ml   Filed Weights   10/30/12 0553 10/31/12 0540 11/01/12 0520  Weight:  66.906 kg (147 lb 8 oz) 65.8 kg (145 lb 1 oz) 66.134 kg (145 lb 12.8 oz)    Exam:   General:  NAD  Cardiovascular: Irregularly irregular  Respiratory: CTAB  Abdomen: Soft/NT/ND/+BS. Reducible ventral hernia  Extremities: No c/c/e  Data Reviewed: Basic Metabolic Panel:  Recent Labs Lab 10/30/12 0024 10/31/12 0610 11/01/12 0415  NA 142 144 140  K 3.7 3.9 3.7  CL 102 109 106  CO2 27 24 27   GLUCOSE 136* 78 70  BUN 27* 23 20  CREATININE 1.20* 1.22* 1.13*  CALCIUM 10.3 8.8 8.4   Liver Function Tests:  Recent Labs Lab 10/30/12 0024  AST 23  ALT 16  ALKPHOS 167*  BILITOT 0.7  PROT 8.8*  ALBUMIN 4.0    Recent Labs Lab 10/30/12 0024  LIPASE 18   No results found for this basename: AMMONIA,  in the last 168 hours CBC:  Recent Labs Lab 10/30/12 0024 10/31/12 0610 11/01/12 0415  WBC 12.0* 8.5 7.7  NEUTROABS 10.9*  --   --   HGB 13.5 11.7* 10.3*  HCT 40.2 36.2 31.5*  MCV 90.1 90.7 89.7  PLT 141* 141* 127*   Cardiac Enzymes: No results found for this basename: CKTOTAL, CKMB, CKMBINDEX, TROPONINI,  in the last 168 hours BNP (last 3 results)  Recent Labs  11/21/11 2315 10/30/12 1130 11/01/12 0415  PROBNP 7408.0* 5592.0* 3886.0*   CBG: No results found for this basename: GLUCAP,  in the last 168 hours  No results found for this or any  previous visit (from the past 240 hour(s)).   Studies: Dg Abd 1 View  10/31/2012   *RADIOLOGY REPORT*  Clinical Data: Follow up small bowel obstruction  ABDOMEN - 1 VIEW  Comparison: 10/31/2012  Findings: Enteric contrast material is again noted within the left side of colon and rectum.  No dilated small bowel loops or air fluid levels identified.  IMPRESSION:  1.  Stable bowel gas pattern.  No obstructive features identified.   Original Report Authenticated By: Signa Kell, M.D.   Dg Abd Portable 1v  10/31/2012   *RADIOLOGY REPORT*  Clinical Data: Follow up small bowel obstruction  PORTABLE ABDOMEN - 1 VIEW   Comparison: CT abdomen/pelvis 10/30/2012  Findings: Interval resolution of small bowel obstruction.  No dilated loops of small bowel are identified.  There is been distal progression of oral contrast material into the colon all the way to the rectum.  Marked rotary dextroconvex scoliosis of the lumbar spine.  Cholelithiasis.  Atelectasis versus scarring in the left lower lobe.  IMPRESSION: Interval resolution of small bowel obstruction with progression of oral contrast material into the colon and rectum.   Original Report Authenticated By: Malachy Moan, M.D.    Scheduled Meds: . diltiazem  360 mg Oral Daily  . enoxaparin (LOVENOX) injection  65 mg Subcutaneous Q12H  . furosemide  20 mg Intravenous BID  . pantoprazole (PROTONIX) IV  40 mg Intravenous Q24H  . Travoprost (BAK Free)  1 drop Both Eyes QHS   Continuous Infusions:   Principal Problem:   SBO (small bowel obstruction) Active Problems:   Breast cancer   Chronic diastolic heart failure   Atrial fibrillation   Chronic anticoagulation   Chronic kidney disease, stage 3, mod decreased GFR   Hypertension   Ventral hernia   Acute diastolic heart failure    Time spent: > 35 mins    [facility]  [facility] Pager [phone]. If 7PM-7AM, please contact night-coverage at [email or web address], password [facility] 11/01/2012, 11:00 AM  LOS: 3 days

## 2012-11-01 NOTE — Progress Notes (Signed)
ANTICOAGULATION CONSULT NOTE - Initial Consult  Pharmacy Consult for Coumadin (also on Lovenox) Indication: atrial fibrillation  No Known Allergies  Patient Measurements: Height: 5\' 2"  (157.5 cm) Weight: 145 lb 12.8 oz (66.134 kg) (scale b) IBW/kg (Calculated) : 50.1   Vital Signs: Temp: 97.6 F (36.4 C) (07/07 1417) Temp src: Oral (07/07 1417) BP: 178/58 mmHg (07/07 1417) Pulse Rate: 64 (07/07 1417)  Labs:  Recent Labs  10/30/12 0024 10/31/12 0610 11/01/12 0415  HGB 13.5 11.7* 10.3*  HCT 40.2 36.2 31.5*  PLT 141* 141* 127*  LABPROT 18.8*  --   --   INR 1.62*  --   --   CREATININE 1.20* 1.22* 1.13*    Estimated Creatinine Clearance: 37.8 ml/min (by C-G formula based on Cr of 1.13).   Medical History: Past Medical History  Diagnosis Date  . Hypertension   . GERD (gastroesophageal reflux disease)   . Glaucoma   . Cataract   . Breast cancer     right  . Valvular heart disease   . Hyperlipidemia   . Gout   . Shortness of breath   . Heart murmur   . CHF (congestive heart failure)     Assessment: Patient is a 79 y.o F transferred to [facility] from [facility] with c/o abdominal pain and n/v. Patient is on coumadin PTA for afib and "heart valve repair." Home dose is 7.5mg  daily with last dose taken on 7/04 (per patient).  INR is 1.62 on 7/5.  Started on Lovenox for subtherapeutic INR.  Pharmacy asked to resume Coumadin today.  Expect INR today lower than 1.6, but will recheck with morning labs.  Goal of Therapy:  Anti-Xa level 0.6-1.2 units/ml 4hrs after LMWH dose given Monitor platelets by anticoagulation protocol: Yes   Plan:  1) Continue Lovenox 65 mg SQ q12h 2) Coumadin 10 mg po x 1 tonight. 3) Daily PT/INR, CBC q 72 hrs.  Tad Moore, BCPS  Clinical Pharmacist Pager [phone]  11/01/2012 5:31 PM

## 2012-11-01 NOTE — Progress Notes (Signed)
Utilization Review Completed.   Franchon Ketterman, RN, BSN Nurse Case Manager  [phone]  

## 2012-11-02 LAB — PROTIME-INR: INR: 1.66 — ABNORMAL HIGH (ref 0.00–1.49)

## 2012-11-02 LAB — BASIC METABOLIC PANEL
BUN: 19 mg/dL (ref 6–23)
Chloride: 104 mEq/L (ref 96–112)
GFR calc Af Amer: 50 mL/min — ABNORMAL LOW (ref >90)
GFR calc non Af Amer: 43 mL/min — ABNORMAL LOW (ref >90)
Potassium: 3.9 mEq/L (ref 3.5–5.1)
Sodium: 138 mEq/L (ref 135–145)

## 2012-11-02 LAB — PRO B NATRIURETIC PEPTIDE: Pro B Natriuretic peptide (BNP): 3265 pg/mL — ABNORMAL HIGH (ref 0–450)

## 2012-11-02 LAB — HEPARIN LEVEL (UNFRACTIONATED): Heparin Unfractionated: 1.1 IU/mL — ABNORMAL HIGH (ref 0.30–0.70)

## 2012-11-02 MED ORDER — HEPARIN (PORCINE) IN NACL 100-0.45 UNIT/ML-% IJ SOLN
1000.0000 [IU]/h | INTRAMUSCULAR | Status: DC
  Administered 2012-11-02: 1000 [IU]/h via INTRAVENOUS
  Filled 2012-11-02: qty 250

## 2012-11-02 MED ORDER — WARFARIN SODIUM 10 MG PO TABS
10.0000 mg | ORAL_TABLET | Freq: Once | ORAL | Status: AC
  Administered 2012-11-02: 10 mg via ORAL
  Filled 2012-11-02: qty 1

## 2012-11-02 MED ORDER — FUROSEMIDE 10 MG/ML IJ SOLN
40.0000 mg | Freq: Two times a day (BID) | INTRAMUSCULAR | Status: DC
  Administered 2012-11-02 – 2012-11-03 (×3): 40 mg via INTRAVENOUS
  Filled 2012-11-02 (×4): qty 4

## 2012-11-02 MED ORDER — HEPARIN (PORCINE) IN NACL 100-0.45 UNIT/ML-% IJ SOLN
800.0000 [IU]/h | INTRAMUSCULAR | Status: DC
  Administered 2012-11-02 – 2012-11-07 (×5): 800 [IU]/h via INTRAVENOUS
  Filled 2012-11-02 (×6): qty 250

## 2012-11-02 NOTE — Progress Notes (Signed)
ANTICOAGULATION CONSULT NOTE  Pharmacy Consult for  Heparin Indication: atrial fibrillation  No Known Allergies  Labs:  Recent Labs  10/31/12 0610 11/01/12 0415 11/02/12 0425 11/02/12 2053  HGB 11.7* 10.3*  --   --   HCT 36.2 31.5*  --   --   PLT 141* 127*  --   --   LABPROT  --   --  19.1*  --   INR  --   --  1.66*  --   HEPARINUNFRC  --   --   --  1.10*  CREATININE 1.22* 1.13* 1.20*  --     Estimated Creatinine Clearance: 35.6 ml/min (by C-G formula based on Cr of 1.2).   Medical History: Past Medical History  Diagnosis Date  . Hypertension   . GERD (gastroesophageal reflux disease)   . Glaucoma   . Cataract   . Breast cancer     right  . Valvular heart disease   . Hyperlipidemia   . Gout   . Shortness of breath   . Heart murmur   . CHF (congestive heart failure)     Assessment: Patient is a 79 y.o F transferred to [facility] from [facility] with c/o abdominal pain and n/v. Patient is on coumadin PTA for afib and "heart valve repair." Patient is on heparin at 1000 units/hr.  The initial heparin level is above goal at 1.1.  Lovenox changed to heparin today. Last dose of lovenox was 11pm on 11/01/12 and heparin infusion was started at 10:30am today. Patient noted with SCr= 1.2 and CrCl ~35.  It is likely that renal insufficiency is effecting elimination of lovenox but with last dose being last pm there is also a possibility of lab error.  Goal of Therapy:  Heparin level = 0.3-0.7 IMonitor platelets by anticoagulation protocol: Yes   Plan:   -Hold heparin for 1 hour then decrease to 800 units/hr -Heparin level in 8hrs and daily  Harland German, Pharm D 11/02/2012 9:36 PM

## 2012-11-02 NOTE — Progress Notes (Signed)
*  PRELIMINARY RESULTS* Echocardiogram 2D Echocardiogram has been performed.  [name] 11/02/2012, 11:40 AM

## 2012-11-02 NOTE — Progress Notes (Signed)
ANTICOAGULATION CONSULT NOTE  Pharmacy Consult for Coumadin / Heparin Indication: atrial fibrillation  No Known Allergies  Labs:  Recent Labs  10/31/12 0610 11/01/12 0415 11/02/12 0425  HGB 11.7* 10.3*  --   HCT 36.2 31.5*  --   PLT 141* 127*  --   LABPROT  --   --  19.1*  INR  --   --  1.66*  CREATININE 1.22* 1.13* 1.20*    Estimated Creatinine Clearance: 35.6 ml/min (by C-G formula based on Cr of 1.2).   Medical History: Past Medical History  Diagnosis Date  . Hypertension   . GERD (gastroesophageal reflux disease)   . Glaucoma   . Cataract   . Breast cancer     right  . Valvular heart disease   . Hyperlipidemia   . Gout   . Shortness of breath   . Heart murmur   . CHF (congestive heart failure)     Assessment: Patient is a 79 y.o F transferred to [facility] from [facility] with c/o abdominal pain and n/v. Patient is on coumadin PTA for afib and "heart valve repair." Home dose is 7.5mg  daily with last dose taken on 7/04 (per patient).  INR is 1.66 today.    Lovenox changed to heparin today, Coumadin contnues  Goal of Therapy:  Heparin level = 0.3-0.7 INR goal 2 to 3? Monitor platelets by anticoagulation protocol: Yes   Plan:  1) Heparin at 1000 units / hr with 8 hour heparin level 2) Coumadin 10 mg po x 1 tonight. 3) Daily PT/INR, CBC, heparin level  Thank you. Okey Regal, PharmD [phone]   11/02/2012 8:40 AM

## 2012-11-02 NOTE — Progress Notes (Signed)
Nt did not chart Intake for lunch. Rn going off what PT said

## 2012-11-02 NOTE — Progress Notes (Signed)
[facility] PROGRESS NOTE  [name] HQI:[id number] DOB: 01-Aug-1933 DOA: 10/29/2012 PCP: Henri Medal, MD  Assessment/Plan: #1 small bowel obstruction Questionable etiology. Patient with prior surgeries. Clinical improvement. Abdominal x-rays show resolution of small bowel obstruction with contrast in the rectum. Patient with BM. Patient tolerating current diet of solids.   #2 acute on chronic diastolic CHF BNP was moderately elevated on admission at 5600. I/O = -1305/ 24 hrs. 2 d echo pending. Patient asymptomatic.  Will increase IV Lasix to 40 mg every 12. Follow.  #3 atrial fibrillation Cardizem for rate control. Change full dose Lovenox to IV heparin until INR is therapeutic as patient has mitral valve replacement. Coumadin has been resumed.    #4 hypertension Stable. Continue Cardizem.  #5 chronic kidney disease stage III Stable.  #6 history of breast cancer Stable. In remission. Followup as outpatient.  #7 ventral hernia Patient to followup with general surgery as outpatient.  #8 prophylaxis PPI for GI prophylaxis. Lovenox for DVT prophylaxis.  Code Status: Full Family Communication: Updated patient no family at bedside. Disposition Plan: Home when INR is therapeutic and when medically stable.   Consultants:  None  Procedures:  CT abdomen and pelvis 10/31/2038  Abdominal x-ray 10/31/2012  2-D ECHO 11/02/2012  Antibiotics:  None  HPI/Subjective: Patient states abdominal pain improved. Patient with BM this morning. Patient tolerating oral intake. Patient denies any shortness of breath. No complaints.  Objective: Filed Vitals:   11/01/12 2144 11/02/12 0559 11/02/12 0857 11/02/12 1030  BP: 153/68 175/61 163/57 156/50  Pulse: 72 61 71 68  Temp: 97.7 F (36.5 C) 98.2 F (36.8 C)    TempSrc: Oral Oral    Resp: 18 18    Height:      Weight:  66.316 kg (146 lb 3.2 oz)    SpO2: 100% 95%      Intake/Output Summary (Last 24 hours) at  11/02/12 1335 Last data filed at 11/02/12 1155  Gross per 24 hour  Intake    960 ml  Output   1100 ml  Net   -140 ml   Filed Weights   10/31/12 0540 11/01/12 0520 11/02/12 0559  Weight: 65.8 kg (145 lb 1 oz) 66.134 kg (145 lb 12.8 oz) 66.316 kg (146 lb 3.2 oz)    Exam:   General:  NAD  Cardiovascular: Irregularly irregular  Respiratory: CTAB  Abdomen: Soft/NT/ND/+BS. Reducible ventral hernia  Extremities: No c/c/e  Data Reviewed: Basic Metabolic Panel:  Recent Labs Lab 10/30/12 0024 10/31/12 0610 11/01/12 0415 11/02/12 0425  NA 142 144 140 138  K 3.7 3.9 3.7 3.9  CL 102 109 106 104  CO2 27 24 27 26   GLUCOSE 136* 78 70 71  BUN 27* 23 20 19   CREATININE 1.20* 1.22* 1.13* 1.20*  CALCIUM 10.3 8.8 8.4 8.3*   Liver Function Tests:  Recent Labs Lab 10/30/12 0024  AST 23  ALT 16  ALKPHOS 167*  BILITOT 0.7  PROT 8.8*  ALBUMIN 4.0    Recent Labs Lab 10/30/12 0024  LIPASE 18   No results found for this basename: AMMONIA,  in the last 168 hours CBC:  Recent Labs Lab 10/30/12 0024 10/31/12 0610 11/01/12 0415  WBC 12.0* 8.5 7.7  NEUTROABS 10.9*  --   --   HGB 13.5 11.7* 10.3*  HCT 40.2 36.2 31.5*  MCV 90.1 90.7 89.7  PLT 141* 141* 127*   Cardiac Enzymes: No results found for this basename: CKTOTAL, CKMB, CKMBINDEX, TROPONINI,  in the last  168 hours BNP (last 3 results)  Recent Labs  10/30/12 1130 11/01/12 0415 11/02/12 0425  PROBNP 5592.0* 3886.0* 3265.0*   CBG: No results found for this basename: GLUCAP,  in the last 168 hours  No results found for this or any previous visit (from the past 240 hour(s)).   Studies: No results found.  Scheduled Meds: . diltiazem  360 mg Oral Daily  . febuxostat  40 mg Oral Daily  . furosemide  20 mg Intravenous BID  . pantoprazole  40 mg Oral Q1200  . potassium chloride SA  10 mEq Oral BID  . Travoprost (BAK Free)  1 drop Both Eyes QHS  . warfarin  10 mg Oral ONCE-1800  . Warfarin - Pharmacist  Dosing Inpatient   Does not apply q1800   Continuous Infusions: . heparin 1,000 Units/hr (11/02/12 1038)    Principal Problem:   SBO (small bowel obstruction) Active Problems:   Breast cancer   Chronic diastolic heart failure   Atrial fibrillation   Chronic anticoagulation   Chronic kidney disease, stage 3, mod decreased GFR   Hypertension   Ventral hernia   Acute diastolic heart failure    Time spent: > 35 mins    [facility]  [facility] Pager [phone]. If 7PM-7AM, please contact night-coverage at [email or web address], password [facility] 11/02/2012, 1:35 PM  LOS: 4 days

## 2012-11-02 NOTE — Progress Notes (Signed)
Pt started on hep drip in Lt arm Rt arm restricted d/t Right br. Lumpectomy. Page On area to draw. Md said use the Foot

## 2012-11-02 NOTE — Care Management Note (Unsigned)
    Page 1 of 1   11/02/2012     11:28:38 AM   CARE MANAGEMENT NOTE 11/02/2012  Patient:  CARLI, LEFEVERS   Account Number:  [id number]  Date Initiated:  11/02/2012  Documentation initiated by:  GRAVES-BIGELOW,Ellicia Alix  Subjective/Objective Assessment:   Pt admitted for Abdominal pain- BNP elevated  (CHF) on Iv lasix . Plan to return home once medically stable for d/c.     Action/Plan:   No needs form CM at this time. Will continue to monitor for any additional dispsosition needs. Pt may benefit from [facility] for disease management.   Anticipated DC Date:  11/04/2012   Anticipated DC Plan:  HOME W HOME HEALTH SERVICES      DC Planning Services  CM consult      Choice offered to / List presented to:             Status of service:  In process, will continue to follow Medicare Important Message given?   (If response is "NO", the following Medicare IM given date fields will be blank) Date Medicare IM given:   Date Additional Medicare IM given:    Discharge Disposition:    Per UR Regulation:  Reviewed for med. necessity/level of care/duration of stay  If discussed at Long Length of Stay Meetings, dates discussed:    Comments:

## 2012-11-03 DIAGNOSIS — Z9889 Other specified postprocedural states: Secondary | ICD-10-CM

## 2012-11-03 LAB — BASIC METABOLIC PANEL
BUN: 20 mg/dL (ref 6–23)
Creatinine, Ser: 1.32 mg/dL — ABNORMAL HIGH (ref 0.50–1.10)
GFR calc Af Amer: 44 mL/min — ABNORMAL LOW (ref >90)
GFR calc non Af Amer: 38 mL/min — ABNORMAL LOW (ref >90)

## 2012-11-03 LAB — CBC
HCT: 33.7 % — ABNORMAL LOW (ref 36.0–46.0)
Hemoglobin: 11.2 g/dL — ABNORMAL LOW (ref 12.0–15.0)
MCH: 29.7 pg (ref 26.0–34.0)
MCHC: 33.2 g/dL (ref 30.0–36.0)
MCV: 89.4 fL (ref 78.0–100.0)
Platelets: 128 10*3/uL — ABNORMAL LOW (ref 150–400)
RBC: 3.77 MIL/uL — ABNORMAL LOW (ref 3.87–5.11)
RDW: 17.1 % — ABNORMAL HIGH (ref 11.5–15.5)
WBC: 7.6 10*3/uL (ref 4.0–10.5)

## 2012-11-03 LAB — PROTIME-INR
INR: 1.52 — ABNORMAL HIGH (ref 0.00–1.49)
Prothrombin Time: 17.9 seconds — ABNORMAL HIGH (ref 11.6–15.2)

## 2012-11-03 LAB — HEPARIN LEVEL (UNFRACTIONATED): Heparin Unfractionated: 0.65 IU/mL (ref 0.30–0.70)

## 2012-11-03 MED ORDER — WARFARIN SODIUM 10 MG PO TABS
10.0000 mg | ORAL_TABLET | Freq: Once | ORAL | Status: AC
  Administered 2012-11-03: 10 mg via ORAL
  Filled 2012-11-03: qty 1

## 2012-11-03 NOTE — Progress Notes (Addendum)
[facility] PROGRESS NOTE  [name] ZOX:[id number] DOB: 05-15-1933 DOA: 10/29/2012 PCP: Henri Medal, MD  Assessment/Plan: #1 small bowel obstruction Uncleat etiology. Patient with prior surgeries. Clinically improved. Abdominal x-rays show resolution of small bowel obstruction with contrast in the rectum. Patient with BM. Patient tolerating current diet of solids.  -resolved  #2 acute on chronic diastolic CHF BNP was moderately elevated on admission at 5600. I/O = -1644/ 24 hrs.  2 d echo with EF 60-65% . Patient asymptomatic.  -Continue IV lasix for today, change to po in am #3 atrial fibrillation Cardizem for rate control. Change full dose Lovenox was changed to IV heparin until INR is therapeutic as patient has mitral valve replacement. Continue Coumadin     #4 hypertension Stable. Continue Cardizem.  #5 chronic kidney disease stage III Stable.  #6 history of breast cancer Stable. In remission. Followup as outpatient.  #7 ventral hernia Patient to followup with general surgery as outpatient.  #8 prophylaxis PPI for GI prophylaxis. On full anticoagulation  Code Status: Full Family Communication: Updated patient no family at bedside. Disposition Plan: Home when INR is therapeutic and when medically stable.   Consultants:  None  Procedures:  CT abdomen and pelvis 10/31/2038  Abdominal x-ray 10/31/2012  2-D ECHO 11/02/2012  Antibiotics:  None  HPI/Subjective: Patient doing well today, +BM, tolerating po well and denies SOB. Also denies abd pain. Objective: Filed Vitals:   11/03/12 0500 11/03/12 0537 11/03/12 0958 11/03/12 1500  BP:  158/49 158/60 133/61  Pulse:  56 81 69  Temp:  98.1 F (36.7 C)  97 F (36.1 C)  TempSrc:  Oral  Oral  Resp:  18  18  Height:      Weight: 65.862 kg (145 lb 3.2 oz) 65.862 kg (145 lb 3.2 oz)    SpO2:  95%  96%    Intake/Output Summary (Last 24 hours) at 11/03/12 1538 Last data filed at 11/03/12  1500  Gross per 24 hour  Intake 1281.2 ml  Output   2926 ml  Net -1644.8 ml   Filed Weights   11/02/12 0559 11/03/12 0500 11/03/12 0537  Weight: 66.316 kg (146 lb 3.2 oz) 65.862 kg (145 lb 3.2 oz) 65.862 kg (145 lb 3.2 oz)    Exam:   General:  NAD  Cardiovascular: Irregularly irregular, rate controlled  Respiratory: CTAB  Abdomen: Soft/NT/ND/+BS. Reducible ventral hernia  Extremities: No c/c/e  Data Reviewed: Basic Metabolic Panel:  Recent Labs Lab 10/30/12 0024 10/31/12 0610 11/01/12 0415 11/02/12 0425 11/03/12 0850  NA 142 144 140 138 138  K 3.7 3.9 3.7 3.9 3.9  CL 102 109 106 104 104  CO2 27 24 27 26 22   GLUCOSE 136* 78 70 71 138*  BUN 27* 23 20 19 20   CREATININE 1.20* 1.22* 1.13* 1.20* 1.32*  CALCIUM 10.3 8.8 8.4 8.3* 8.9   Liver Function Tests:  Recent Labs Lab 10/30/12 0024  AST 23  ALT 16  ALKPHOS 167*  BILITOT 0.7  PROT 8.8*  ALBUMIN 4.0    Recent Labs Lab 10/30/12 0024  LIPASE 18   No results found for this basename: AMMONIA,  in the last 168 hours CBC:  Recent Labs Lab 10/30/12 0024 10/31/12 0610 11/01/12 0415 11/03/12 0850  WBC 12.0* 8.5 7.7 7.6  NEUTROABS 10.9*  --   --   --   HGB 13.5 11.7* 10.3* 11.2*  HCT 40.2 36.2 31.5* 33.7*  MCV 90.1 90.7 89.7 89.4  PLT 141* 141* 127* 128*  Cardiac Enzymes: No results found for this basename: CKTOTAL, CKMB, CKMBINDEX, TROPONINI,  in the last 168 hours BNP (last 3 results)  Recent Labs  11/01/12 0415 11/02/12 0425 11/03/12 0850  PROBNP 3886.0* 3265.0* 3330.0*   CBG: No results found for this basename: GLUCAP,  in the last 168 hours  No results found for this or any previous visit (from the past 240 hour(s)).   Studies: No results found.  Scheduled Meds: . diltiazem  360 mg Oral Daily  . febuxostat  40 mg Oral Daily  . furosemide  40 mg Intravenous BID  . pantoprazole  40 mg Oral Q1200  . potassium chloride SA  10 mEq Oral BID  . Travoprost (BAK Free)  1 drop Both  Eyes QHS  . warfarin  10 mg Oral ONCE-1800  . Warfarin - Pharmacist Dosing Inpatient   Does not apply q1800   Continuous Infusions: . heparin 800 Units/hr (11/03/12 1314)    Principal Problem:   SBO (small bowel obstruction) Active Problems:   Breast cancer   Chronic diastolic heart failure   Atrial fibrillation   Chronic anticoagulation   Chronic kidney disease, stage 3, mod decreased GFR   Hypertension   Ventral hernia   Acute diastolic heart failure    Time spent: 25 mins    Michaeljames Milnes C  [facility] Pager [phone]. If 7PM-7AM, please contact night-coverage at [email or web address], password [facility] 11/03/2012, 3:38 PM  LOS: 5 days

## 2012-11-03 NOTE — Progress Notes (Signed)
Pt alert and oriented times 4, ambulates with one person assist, NAD noted, will continue to monitor.

## 2012-11-03 NOTE — Progress Notes (Signed)
ANTICOAGULATION CONSULT NOTE  Pharmacy Consult for Coumadin / Heparin Indication: atrial fibrillation  No Known Allergies  Labs:  Recent Labs  11/01/12 0415 11/02/12 0425 11/02/12 2053 11/03/12 0850  HGB 10.3*  --   --  11.2*  HCT 31.5*  --   --  33.7*  PLT 127*  --   --  128*  LABPROT  --  19.1*  --  17.9*  INR  --  1.66*  --  1.52*  HEPARINUNFRC  --   --  1.10* 0.65  CREATININE 1.13* 1.20*  --  1.32*    Estimated Creatinine Clearance: 32.3 ml/min (by C-G formula based on Cr of 1.32).   Assessment: Patient is a 79 y.o F transferred to [facility] from [facility] with c/o abdominal pain and n/v. Patient is on coumadin PTA for afib and "heart valve repair." Home dose is 7.5mg  daily with last dose taken on 7/04 (per patient).  INR is 1.52 today.  Heparin level therapeutic.  Goal of Therapy:  Heparin level = 0.3-0.7 INR goal 2 to 3? Monitor platelets by anticoagulation protocol: Yes   Plan:  1) Continue heparin at 800 units / hr 2) Repeat Coumadin 10 mg po x 1 tonight. (will increase in AM if INR still low) 3) Daily PT/INR, CBC, heparin level  Thank you. Okey Regal, PharmD [phone]   11/03/2012 10:09 AM

## 2012-11-03 NOTE — Progress Notes (Signed)
Pt. A/Ox4. She is 1 person assist and on room air. She has continuous IV fluids running and is on a heparin drip. Pharmacy called and reported that patient's heparin level was elevated so was told to hold heparin for 1 hour then restart at 8 ml / hr ( 800 units). Pt.HR decreased to 37 bpm non-sustained at the lowest, during the shift while patient was resting in bed. Pt.is asymptomatic and has no signs of distress. Will continue to monitor

## 2012-11-04 LAB — BASIC METABOLIC PANEL
BUN: 24 mg/dL — ABNORMAL HIGH (ref 6–23)
CO2: 21 mEq/L (ref 19–32)
Chloride: 103 mEq/L (ref 96–112)
GFR calc non Af Amer: 40 mL/min — ABNORMAL LOW (ref >90)
Glucose, Bld: 85 mg/dL (ref 70–99)
Potassium: 4.4 mEq/L (ref 3.5–5.1)

## 2012-11-04 LAB — MAGNESIUM: Magnesium: 1.7 mg/dL (ref 1.5–2.5)

## 2012-11-04 LAB — CBC
HCT: 34.1 % — ABNORMAL LOW (ref 36.0–46.0)
Hemoglobin: 11.2 g/dL — ABNORMAL LOW (ref 12.0–15.0)
MCV: 89.3 fL (ref 78.0–100.0)
RDW: 17.1 % — ABNORMAL HIGH (ref 11.5–15.5)
WBC: 6.7 10*3/uL (ref 4.0–10.5)

## 2012-11-04 LAB — TROPONIN I: Troponin I: <0.3 ng/mL (ref <0.30)

## 2012-11-04 MED ORDER — FUROSEMIDE 40 MG PO TABS
40.0000 mg | ORAL_TABLET | Freq: Two times a day (BID) | ORAL | Status: DC
  Administered 2012-11-04 – 2012-11-08 (×10): 40 mg via ORAL
  Filled 2012-11-04 (×10): qty 1

## 2012-11-04 MED ORDER — WARFARIN SODIUM 10 MG PO TABS
10.0000 mg | ORAL_TABLET | Freq: Once | ORAL | Status: AC
  Administered 2012-11-04: 10 mg via ORAL
  Filled 2012-11-04: qty 1

## 2012-11-04 NOTE — Progress Notes (Signed)
[facility] PROGRESS NOTE  [name] UVO:[id number] DOB: Dec 13, 1933 DOA: 10/29/2012 PCP: Henri Medal, MD  Assessment/Plan: #1 small bowel obstruction Uncleat etiology. Patient with prior surgeries. Clinically improved. Abdominal x-rays show resolution of small bowel obstruction with contrast in the rectum. Patient with BM. Patient tolerating current diet of solids.  -resolved  #2 acute on chronic diastolic CHF BNP was moderately elevated on admission at 5600. I/O = -1644/ 24 hrs.  2 d echo with EF 60-65% . Patient asymptomatic.  - On oral lasix started this am, she appears euvolemic #3 atrial fibrillation Cardizem for rate control. Change full dose Lovenox was changed to IV heparin until INR is therapeutic as patient has mitral valve replacement. Continue Coumadin, INR 1.7 today, follow     #4 hypertension Stable. Continue Cardizem.  #5 chronic kidney disease stage III Stable.  #6 history of breast cancer Stable. In remission. Followup as outpatient.  #7 ventral hernia Patient to followup with general surgery as outpatient.  #8 prophylaxis PPI for GI prophylaxis. On full anticoagulation  Code Status: Full Family Communication: Updated patient no family at bedside. Disposition Plan: Home when INR is therapeutic and when medically stable.   Consultants:  None  Procedures:  CT abdomen and pelvis 10/31/2038  Abdominal x-ray 10/31/2012  2-D ECHO 11/02/2012  Antibiotics:  None  HPI/Subjective: Patient doing well today, denies SOB and no CP. Also denies abd pain. Objective: Filed Vitals:   11/03/12 0958 11/03/12 1500 11/03/12 1958 11/04/12 0511  BP: 158/60 133/61 132/48 162/65  Pulse: 81 69 57 65  Temp:  97 F (36.1 C) 98.4 F (36.9 C) 98.3 F (36.8 C)  TempSrc:  Oral Oral Oral  Resp:  18 18 16   Height:      Weight:    65.454 kg (144 lb 4.8 oz)  SpO2:  96% 98% 94%    Intake/Output Summary (Last 24 hours) at 11/04/12 1115 Last data  filed at 11/04/12 0838  Gross per 24 hour  Intake 984.73 ml  Output   1375 ml  Net -390.27 ml   Filed Weights   11/03/12 0500 11/03/12 0537 11/04/12 0511  Weight: 65.862 kg (145 lb 3.2 oz) 65.862 kg (145 lb 3.2 oz) 65.454 kg (144 lb 4.8 oz)    Exam:   General:  NAD  Cardiovascular: Irregularly irregular, rate controlled  Respiratory: CTAB  Abdomen: Soft/NT/ND/+BS. Reducible ventral hernia  Extremities: No c/c/e  Data Reviewed: Basic Metabolic Panel:  Recent Labs Lab 10/31/12 0610 11/01/12 0415 11/02/12 0425 11/03/12 0850 11/04/12 0630  NA 144 140 138 138 138  K 3.9 3.7 3.9 3.9 4.4  CL 109 106 104 104 103  CO2 24 27 26 22 21   GLUCOSE 78 70 71 138* 85  BUN 23 20 19 20  24*  CREATININE 1.22* 1.13* 1.20* 1.32* 1.28*  CALCIUM 8.8 8.4 8.3* 8.9 8.8   Liver Function Tests:  Recent Labs Lab 10/30/12 0024  AST 23  ALT 16  ALKPHOS 167*  BILITOT 0.7  PROT 8.8*  ALBUMIN 4.0    Recent Labs Lab 10/30/12 0024  LIPASE 18   No results found for this basename: AMMONIA,  in the last 168 hours CBC:  Recent Labs Lab 10/30/12 0024 10/31/12 0610 11/01/12 0415 11/03/12 0850 11/04/12 0630  WBC 12.0* 8.5 7.7 7.6 6.7  NEUTROABS 10.9*  --   --   --   --   HGB 13.5 11.7* 10.3* 11.2* 11.2*  HCT 40.2 36.2 31.5* 33.7* 34.1*  MCV 90.1 90.7  89.7 89.4 89.3  PLT 141* 141* 127* 128* 128*   Cardiac Enzymes: No results found for this basename: CKTOTAL, CKMB, CKMBINDEX, TROPONINI,  in the last 168 hours BNP (last 3 results)  Recent Labs  11/01/12 0415 11/02/12 0425 11/03/12 0850  PROBNP 3886.0* 3265.0* 3330.0*   CBG: No results found for this basename: GLUCAP,  in the last 168 hours  No results found for this or any previous visit (from the past 240 hour(s)).   Studies: No results found.  Scheduled Meds: . diltiazem  360 mg Oral Daily  . febuxostat  40 mg Oral Daily  . furosemide  40 mg Oral BID  . pantoprazole  40 mg Oral Q1200  . potassium chloride SA  10  mEq Oral BID  . Travoprost (BAK Free)  1 drop Both Eyes QHS  . warfarin  10 mg Oral ONCE-1800  . Warfarin - Pharmacist Dosing Inpatient   Does not apply q1800   Continuous Infusions: . heparin 800 Units/hr (11/03/12 1314)    Principal Problem:   SBO (small bowel obstruction) Active Problems:   Breast cancer   Chronic diastolic heart failure   Atrial fibrillation   Chronic anticoagulation   Chronic kidney disease, stage 3, mod decreased GFR   Hypertension   Ventral hernia   Acute diastolic heart failure    Time spent: 25 mins    Jazlynne Milliner C  [facility] Pager [phone]. If 7PM-7AM, please contact night-coverage at [email or web address], password [facility] 11/04/2012, 11:15 AM  LOS: 6 days

## 2012-11-04 NOTE — Progress Notes (Signed)
ANTICOAGULATION CONSULT NOTE  Pharmacy Consult for Coumadin / Heparin Indication: atrial fibrillation  No Known Allergies  Labs:  Recent Labs  11/02/12 0425 11/02/12 2053 11/03/12 0850 11/04/12 0630  HGB  --   --  11.2* 11.2*  HCT  --   --  33.7* 34.1*  PLT  --   --  128* 128*  LABPROT 19.1*  --  17.9* 19.5*  INR 1.66*  --  1.52* 1.70*  HEPARINUNFRC  --  1.10* 0.65 0.43  CREATININE 1.20*  --  1.32* 1.28*    Estimated Creatinine Clearance: 33.2 ml/min (by C-G formula based on Cr of 1.28).   Assessment: Patient is a 79 y.o F transferred to [facility] from [facility] with c/o abdominal pain and n/v. Patient is on coumadin PTA for afib and "heart valve repair." Home dose is 7.5mg  daily with last dose taken on 7/04 (per patient).  INR is 1.7 today.  Heparin level therapeutic.  Goal of Therapy:  Heparin level = 0.3-0.7 INR goal 2 to 3? Monitor platelets by anticoagulation protocol: Yes   Plan:  1) Continue heparin at 800 units / hr 2) Repeat Coumadin 10 mg po x 1 tonight.  3) Daily PT/INR, CBC, heparin level  Thank you. Piedad Climes, PharmD [phone]   11/04/2012 10:11 AM

## 2012-11-05 DIAGNOSIS — I5032 Chronic diastolic (congestive) heart failure: Secondary | ICD-10-CM

## 2012-11-05 LAB — PROTIME-INR: INR: 1.76 — ABNORMAL HIGH (ref 0.00–1.49)

## 2012-11-05 LAB — BASIC METABOLIC PANEL
BUN: 29 mg/dL — ABNORMAL HIGH (ref 6–23)
CO2: 24 mEq/L (ref 19–32)
Calcium: 9.3 mg/dL (ref 8.4–10.5)
GFR calc non Af Amer: 37 mL/min — ABNORMAL LOW (ref >90)
Glucose, Bld: 80 mg/dL (ref 70–99)
Potassium: 4.5 mEq/L (ref 3.5–5.1)
Sodium: 138 mEq/L (ref 135–145)

## 2012-11-05 LAB — CBC
HCT: 33.8 % — ABNORMAL LOW (ref 36.0–46.0)
Hemoglobin: 11.3 g/dL — ABNORMAL LOW (ref 12.0–15.0)
MCV: 88.7 fL (ref 78.0–100.0)
RBC: 3.81 MIL/uL — ABNORMAL LOW (ref 3.87–5.11)
WBC: 6.2 10*3/uL (ref 4.0–10.5)

## 2012-11-05 LAB — TROPONIN I
Troponin I: <0.3 ng/mL (ref <0.30)
Troponin I: <0.3 ng/mL (ref <0.30)

## 2012-11-05 MED ORDER — WARFARIN SODIUM 2.5 MG PO TABS
12.5000 mg | ORAL_TABLET | Freq: Once | ORAL | Status: AC
  Administered 2012-11-05: 12.5 mg via ORAL
  Filled 2012-11-05: qty 1

## 2012-11-05 NOTE — Progress Notes (Addendum)
[facility] PROGRESS NOTE  [name] EAV:[id number] DOB: November 08, 1933 DOA: 10/29/2012 PCP: Henri Medal, MD  Assessment/Plan: #1 small bowel obstruction Unclear etiology. Patient with prior surgeries. Clinically improved. Abdominal x-rays show resolution of small bowel obstruction with contrast in the rectum. Patient with BM. Patient tolerating current diet of solids.  -resolved  #2 acute on chronic diastolic CHF BNP was moderately elevated on admission at 5600. I/O = -1644/ 24 hrs.  2 d echo with EF 60-65% . Patient asymptomatic.  - continue oral lasix, she appears euvolemic #3 atrial fibrillation Cardizem for rate control. Change full dose Lovenox was changed to IV heparin until INR is therapeutic as patient has mitral valve replacement. Continue Coumadin, INR 1.76 today, follow     #4 hypertension Stable. Continue Cardizem.  #5 chronic kidney disease stage III Stable.  #6 history of breast cancer Stable. In remission. Followup as outpatient.  #7 ventral hernia Patient to followup with general surgery as outpatient.  #8 prophylaxis PPI for GI prophylaxis. On full anticoagulation  Code Status: Full Family Communication: Updated patient no family at bedside. Disposition Plan: Home when INR is therapeutic and when medically stable.   Consultants:  None  Procedures:  CT abdomen and pelvis 10/31/2038  Abdominal x-ray 10/31/2012  2-D ECHO 11/02/2012  Antibiotics:  None  HPI/Subjective: Patient denies any c/o.  Objective: Filed Vitals:   11/05/12 0528 11/05/12 0928 11/05/12 1428 11/05/12 2043  BP: 145/49 128/57 117/58 150/59  Pulse: 54 59 56 59  Temp: 97.8 F (36.6 C)  97.9 F (36.6 C) 97.8 F (36.6 C)  TempSrc: Oral  Oral Oral  Resp: 17  18 17   Height:      Weight: 65.091 kg (143 lb 8 oz)     SpO2: 95%  98% 98%    Intake/Output Summary (Last 24 hours) at 11/05/12 2046 Last data filed at 11/05/12 2013  Gross per 24 hour  Intake     720 ml  Output   1125 ml  Net   -405 ml   Filed Weights   11/03/12 0537 11/04/12 0511 11/05/12 0528  Weight: 65.862 kg (145 lb 3.2 oz) 65.454 kg (144 lb 4.8 oz) 65.091 kg (143 lb 8 oz)    Exam:   General:  NAD  Cardiovascular: Irregularly irregular, rate controlled  Respiratory: CTAB  Abdomen: Soft/NT/ND/+BS. Reducible ventral hernia  Extremities: No c/c/e  Data Reviewed: Basic Metabolic Panel:  Recent Labs Lab 11/01/12 0415 11/02/12 0425 11/03/12 0850 11/04/12 0630 11/04/12 2220 11/05/12 0505  NA 140 138 138 138  --  138  K 3.7 3.9 3.9 4.4  --  4.5  CL 106 104 104 103  --  102  CO2 27 26 22 21   --  24  GLUCOSE 70 71 138* 85  --  80  BUN 20 19 20  24*  --  29*  CREATININE 1.13* 1.20* 1.32* 1.28*  --  1.35*  CALCIUM 8.4 8.3* 8.9 8.8  --  9.3  MG  --   --   --   --  1.7  --    Liver Function Tests:  Recent Labs Lab 10/30/12 0024  AST 23  ALT 16  ALKPHOS 167*  BILITOT 0.7  PROT 8.8*  ALBUMIN 4.0    Recent Labs Lab 10/30/12 0024  LIPASE 18   No results found for this basename: AMMONIA,  in the last 168 hours CBC:  Recent Labs Lab 10/30/12 0024 10/31/12 0610 11/01/12 0415 11/03/12 0850 11/04/12 0630 11/05/12 0505  WBC 12.0* 8.5 7.7 7.6 6.7 6.2  NEUTROABS 10.9*  --   --   --   --   --   HGB 13.5 11.7* 10.3* 11.2* 11.2* 11.3*  HCT 40.2 36.2 31.5* 33.7* 34.1* 33.8*  MCV 90.1 90.7 89.7 89.4 89.3 88.7  PLT 141* 141* 127* 128* 128* 141*   Cardiac Enzymes:  Recent Labs Lab 11/04/12 2220 11/05/12 0505 11/05/12 0840  TROPONINI <0.30 <0.30 <0.30   BNP (last 3 results)  Recent Labs  11/01/12 0415 11/02/12 0425 11/03/12 0850  PROBNP 3886.0* 3265.0* 3330.0*   CBG: No results found for this basename: GLUCAP,  in the last 168 hours  No results found for this or any previous visit (from the past 240 hour(s)).   Studies: No results found.  Scheduled Meds: . diltiazem  360 mg Oral Daily  . febuxostat  40 mg Oral Daily  . furosemide   40 mg Oral BID  . pantoprazole  40 mg Oral Q1200  . potassium chloride SA  10 mEq Oral BID  . Travoprost (BAK Free)  1 drop Both Eyes QHS  . Warfarin - Pharmacist Dosing Inpatient   Does not apply q1800   Continuous Infusions: . heparin 800 Units/hr (11/04/12 2039)    Principal Problem:   SBO (small bowel obstruction) Active Problems:   Breast cancer   Chronic diastolic heart failure   Atrial fibrillation   Chronic anticoagulation   Chronic kidney disease, stage 3, mod decreased GFR   Hypertension   Ventral hernia   Acute diastolic heart failure    Time spent: 25 mins    Tyde Lamison C  [facility] Pager [phone]. If 7PM-7AM, please contact night-coverage at [email or web address], password [facility] 11/05/2012, 8:46 PM  LOS: 7 days

## 2012-11-05 NOTE — Progress Notes (Signed)
Patient evaluated for community based chronic disease management services with [facility] Care Management Program as a benefit of patient's [employer]. Admitted to [facility] on 7.4.14 with abdominal pain.  Has past medical history of PVD, CHF, AFIB, and HTN.  Services have been accepted.  She has expressed that she occasionally has difficulty affording her medicines.  We will provide her with a pharmacy review.  Currently she fills her prescriptions at [facility] on [address].  Her BNP on admission was 5600.  However she was asymptomatic at that time.  She weighs daily and has a scale.  She lives with family.  Patient will receive a post discharge transition of care call and will be evaluated for monthly home visits for assessments and disease process education. Spoke with patient at bedside to explain [facility] Care Management services.  Left contact information and [facility] literature at bedside. Made inpatient Case Manager aware that [facility] Care Management following. Of note, [facility] Care Management services does not replace or interfere with any services that are arranged by inpatient case management or social work.  For additional questions or referrals please contact Anibal Henderson BSN RN [facility] Liaison at [phone].

## 2012-11-05 NOTE — Progress Notes (Signed)
ANTICOAGULATION CONSULT NOTE  Pharmacy Consult for Coumadin / Heparin Indication: atrial fibrillation, MVR  No Known Allergies  Labs:  Recent Labs  11/03/12 0850 11/04/12 0630 11/04/12 2220 11/05/12 0505  HGB 11.2* 11.2*  --  11.3*  HCT 33.7* 34.1*  --  33.8*  PLT 128* 128*  --  141*  LABPROT 17.9* 19.5*  --  20.0*  INR 1.52* 1.70*  --  1.76*  HEPARINUNFRC 0.65 0.43  --  0.36  CREATININE 1.32* 1.28*  --  1.35*  TROPONINI  --   --  <0.30 <0.30    Estimated Creatinine Clearance: 31.4 ml/min (by C-G formula based on Cr of 1.35).   Assessment: Patient is a 79 y.o F transferred to [facility] from [facility] with c/o abdominal pain and n/v. Patient is on coumadin PTA for afib and "heart valve repair." Home dose is 7.5mg  daily with last dose taken on 7/04 (per patient).  INR is 1.76  today.  Heparin level therapeutic.  Goal of Therapy:  Heparin level = 0.3-0.7 INR goal 2 to 3? Monitor platelets by anticoagulation protocol: Yes   Plan:  1) Continue heparin at 800 units / hr 2) Coumadin 12.5 mg po x 1 3) Daily PT/INR, CBC, heparin level  Thank you. Okey Regal, PharmD [phone]   11/05/2012 9:04 AM

## 2012-11-05 NOTE — Progress Notes (Signed)
Pt ambulated from bed to chair and tolerated well.  Amanda Pea, Charity fundraiser.

## 2012-11-06 DIAGNOSIS — I498 Other specified cardiac arrhythmias: Secondary | ICD-10-CM

## 2012-11-06 LAB — CBC
HCT: 32 % — ABNORMAL LOW (ref 36.0–46.0)
Hemoglobin: 10.7 g/dL — ABNORMAL LOW (ref 12.0–15.0)
MCV: 88.6 fL (ref 78.0–100.0)
RDW: 17 % — ABNORMAL HIGH (ref 11.5–15.5)
WBC: 6.7 10*3/uL (ref 4.0–10.5)

## 2012-11-06 LAB — BASIC METABOLIC PANEL
BUN: 33 mg/dL — ABNORMAL HIGH (ref 6–23)
CO2: 26 mEq/L (ref 19–32)
Chloride: 102 mEq/L (ref 96–112)
Glucose, Bld: 80 mg/dL (ref 70–99)
Potassium: 4.6 mEq/L (ref 3.5–5.1)
Sodium: 135 mEq/L (ref 135–145)

## 2012-11-06 LAB — PROTIME-INR: INR: 1.93 — ABNORMAL HIGH (ref 0.00–1.49)

## 2012-11-06 MED ORDER — DILTIAZEM HCL ER COATED BEADS 240 MG PO CP24: 240.0000 mg | ORAL_CAPSULE | Freq: Every day | ORAL | Status: DC

## 2012-11-06 MED ORDER — WARFARIN SODIUM 2.5 MG PO TABS
12.5000 mg | ORAL_TABLET | Freq: Once | ORAL | Status: AC
  Administered 2012-11-06: 12.5 mg via ORAL
  Filled 2012-11-06: qty 1

## 2012-11-06 MED ORDER — DILTIAZEM HCL ER COATED BEADS 180 MG PO CP24
180.0000 mg | ORAL_CAPSULE | Freq: Every day | ORAL | Status: DC
  Filled 2012-11-06: qty 1

## 2012-11-06 NOTE — Progress Notes (Signed)
OT Cancellation Note  Patient Details Name: [name] MRN: [id number] DOB: 27-Jan-1934   Cancelled Treatment:    Reason Eval/Treat Not Completed: OT screened, no needs identified, will sign off.  Spoke with PT who reports no acute changes since OT signed off on 11/01/12.  11/06/2012 Cipriano Mile OTR/L Pager [phone] Office [phone]

## 2012-11-06 NOTE — Progress Notes (Signed)
Pt had several episodes of  Non sustained bradycardia going down to upper 30's which was noted also to have happened previous shifts. Pt asymptomatic no c/o chest pains nor SOB. Will continue to monitor.

## 2012-11-06 NOTE — Progress Notes (Signed)
ANTICOAGULATION CONSULT NOTE - Follow Up Consult  Pharmacy Consult for Heparin and Coumadin Indication: Afib and MV repair (per ECHO 7/13 and Cardiology note 11/21/11)  No Known Allergies  Patient Measurements: Height: 5\' 2"  (157.5 cm) Weight: 144 lb 6.4 oz (65.499 kg) (a scale) IBW/kg (Calculated) : 50.1 Heparin Dosing Weight: 63.5kg  Vital Signs: Temp: 97.6 F (36.4 C) (07/12 1046) Temp src: Oral (07/12 1046) BP: 139/67 mmHg (07/12 1046) Pulse Rate: 64 (07/12 1046)  Labs:  Recent Labs  11/04/12 0630 11/04/12 2220 11/05/12 0505 11/05/12 0840 11/06/12 0420  HGB 11.2*  --  11.3*  --  10.7*  HCT 34.1*  --  33.8*  --  32.0*  PLT 128*  --  141*  --  140*  LABPROT 19.5*  --  20.0*  --  21.5*  INR 1.70*  --  1.76*  --  1.93*  HEPARINUNFRC 0.43  --  0.36  --  0.37  CREATININE 1.28*  --  1.35*  --  1.35*  TROPONINI  --  <0.30 <0.30 <0.30  --     Estimated Creatinine Clearance: 31.5 ml/min (by C-G formula based on Cr of 1.35).   Medications:  Heparin 800 units/hr  Assessment: 78yof on heparin bridging to Coumadin for Afib and hx MV repair (per ECHO 7/13 and Cardiology note 11/21/11). Heparin level (0.37) remains therapeutic - will continue current heparin rate. INR (1.93) is subtherapeutic but trended up with increased Coumadin dose (12.5mg ) - will repeat Coumadin 12.5mg  dose tonight and follow-up AM INR. Discussed INR goal with Dr. Suanne Marker (MV repair vs replacement) - confirmed goal is 2-3 but will target upper end (2.5-3) with concomitant Afib. - H/H trending down, Plts stable - No significant bleeding reported  Goal of Therapy:  INR 2-3 Heparin level 0.3-0.7 units/ml Monitor platelets by anticoagulation protocol: Yes   Plan:  1. Repeat Coumadin 12.5mg  po x 1 today 2. Continue heparin drip 800 units/hr ( 8 ml/hr) 3. Follow-up AM INR, CBC and heparin level  Cleon Dew [phone] 11/06/2012,11:57 AM

## 2012-11-06 NOTE — Progress Notes (Signed)
Text paged M. Lynch [facility]. Waiting for return call to update with patients bradycardia.

## 2012-11-06 NOTE — Evaluation (Signed)
Physical Therapy Evaluation Patient Details Name: [name] MRN: [id number] DOB: 17-Aug-1933 Today's Date: 11/06/2012 Time: 1478-2956 PT Time Calculation (min): 21 min  PT Assessment / Plan / Recommendation History of Present Illness  Abdominal pain. With past medical history diastolic heart failure and atrial fibrillation on anticoagulation who on 10/29/12 started having severe right lower quadrant abdominal pain associated with vomiting. Patient had noted some previous episodes of this in the weeks prior, but this time it was persistent and much more severe. She could not improve her pain and should she came [facility] for evaluation. Lab work noted a mild leukocytosis, stable creatinine and CT scan noted a small bowel obstruction. She was noted to also have a ventral hernia, however this was independent and unchanged from previous scan. ER physician found this hernia to be reducible. Given these findings, it was felt best that she come in for admission. Cannot hospitalists were contacted and accepted the patient and the patient was transferred to [facility]. Upon initial evaluation given history diastolic heart failure, BNP was checked and found to be elevated at 5600. Patient is asymptomatic of this.  Clinical Impression  Pt was admitted on 10/29/12 and PT evaluation performed on 11/01/12 and d/c from PT services.  Pt has remained in the hospital and PT reordered on 11/06/12.  Pt close to baseline however mild fatigue and generalized weakness therefore with follow pt at this time in the acute setting for further PT.      PT Assessment  Patient needs continued PT services    Follow Up Recommendations  No PT follow up    Does the patient have the potential to tolerate intense rehabilitation      Barriers to Discharge        Equipment Recommendations  None recommended by PT    Recommendations for Other Services     Frequency Min 3X/week     Precautions / Restrictions Precautions Precautions: None Restrictions Weight Bearing Restrictions: No   Pertinent Vitals/Pain No c/o pain      Mobility  Bed Mobility Bed Mobility: Not assessed Transfers Transfers: Sit to Stand;Stand to Sit Sit to Stand: 4: Min assist;From chair/3-in-1 Stand to Sit: 4: Min assist;To chair/3-in-1 Details for Transfer Assistance: (A) to initaite transfer with cues for hand placement Ambulation/Gait Ambulation/Gait Assistance: 4: Min guard Ambulation Distance (Feet): 150 Feet Assistive device: Rolling walker Ambulation/Gait Assistance Details: Minguard for safety.  Pt having difficulty managing RW due to RW continues to steer to right side.   Gait Pattern: Step-through pattern Stairs: No    Exercises     PT Diagnosis: Difficulty walking;Generalized weakness  PT Problem List: Decreased strength;Decreased activity tolerance;Decreased mobility;Decreased knowledge of use of DME;Cardiopulmonary status limiting activity PT Treatment Interventions: DME instruction;Gait training;Functional mobility training;Therapeutic activities;Therapeutic exercise;Balance training;Patient/family education     PT Goals(Current goals can be found in the care plan section) Acute Rehab PT Goals Patient Stated Goal: To go home soon PT Goal Formulation: With patient Potential to Achieve Goals: Good  Visit Information  Last PT Received On: 11/06/12 Assistance Needed: +1 History of Present Illness: Abdominal pain. With past medical history diastolic heart failure and atrial fibrillation on anticoagulation who on 10/29/12 started having severe right lower quadrant abdominal pain associated with vomiting. Patient had noted some previous episodes of this in the weeks prior, but this time it was persistent and much more severe. She could not improve her pain and should she came [facility] for [facility]. Lab work noted a mild leukocytosis, stable  creatinine and CT scan noted a small bowel obstruction. She was noted to also have a ventral hernia, however this was independent and unchanged from previous scan. ER physician found this hernia to be reducible. Given these findings, it was felt best that she come in for admission. Cannot hospitalists were contacted and accepted the patient and the patient was transferred to [facility]. Upon initial evaluation given history diastolic heart failure, BNP was checked and found to be elevated at 5600. Patient is asymptomatic of this.       Prior Functioning  Home Living Family/patient expects to be discharged to:: Private residence Living Arrangements: Alone Available Help at Discharge: Available 24 hours/day Type of Home: House Home Access: Ramped entrance Home Layout: One level Prior Function Level of Independence: Independent with assistive device(s) Communication Communication: No difficulties Dominant Hand: Left    Cognition  Cognition Arousal/Alertness: Awake/alert Behavior During Therapy: WFL for tasks assessed/performed Overall Cognitive Status: Within Functional Limits for tasks assessed    Extremity/Trunk Assessment Lower Extremity Assessment Lower Extremity Assessment: Generalized weakness   Balance    End of Session PT - End of Session Equipment Utilized During Treatment: Gait belt Activity Tolerance: Patient tolerated treatment well Patient left: in chair;with call bell/phone within reach Nurse Communication: Mobility status  GP     Ples Trudel 11/06/2012, 10:40 AM  Jake Shark, PT DPT [phone]

## 2012-11-06 NOTE — Progress Notes (Signed)
[facility] PROGRESS NOTE  [name] UJW:[id number] DOB: 04-26-1934 DOA: 10/29/2012 PCP: Henri Medal, MD  Assessment/Plan: #1 small bowel obstruction Unclear etiology. Patient with prior surgeries. Clinically improved. Abdominal x-rays show resolution of small bowel obstruction with contrast in the rectum. Patient with BM. Patient tolerating current diet of solids.  -resolved  #2 acute on chronic diastolic CHF BNP was moderately elevated on admission at 5600. I/O = -1644/ 24 hrs.  2 d echo with EF 60-65% . Patient asymptomatic.  - continue oral lasix, she appears euvolemic #3 atrial fibrillation, with brady Cardizem for rate control. Change full dose Lovenox was changed to IV heparin until INR is therapeutic as patient has mitral valve replacement. Continue Coumadin, INR 1.76 today, follow    -pt with brady down to upper 30s- 40 last pm- assymptomatic and today while asleep, will decrease cardizem 180mg (from 360) follow -cardiac enzymes neg #4 hypertension Stable. decreased Cardizem dose as above- follow.  #5 chronic kidney disease stage III Stable.  #6 history of breast cancer Stable. In remission. Followup as outpatient.  #7 ventral hernia Patient to followup with general surgery as outpatient.  #8 prophylaxis PPI for GI prophylaxis. On full anticoagulation    PT/OT recommending no follow up  Code Status: Full Family Communication: Updated patient no family at bedside. Disposition Plan: Home when INR is therapeutic and when medically stable.   Consultants:  None  Procedures:  CT abdomen and pelvis 10/31/2038  Abdominal x-ray 10/31/2012  2-D ECHO 11/02/2012  Antibiotics:  None  HPI/Subjective: Patient denies chest pain, no SOB Objective: Filed Vitals:   11/05/12 2043 11/06/12 0327 11/06/12 0515 11/06/12 1046  BP: 150/59 150/50 151/49 139/67  Pulse: 59 47 54 64  Temp: 97.8 F (36.6 C) 97.2 F (36.2 C) 97.5 F (36.4 C) 97.6 F (36.4  C)  TempSrc: Oral Oral Oral Oral  Resp: 17 16 16 18   Height:      Weight:   65.499 kg (144 lb 6.4 oz)   SpO2: 98% 99% 97% 97%    Intake/Output Summary (Last 24 hours) at 11/06/12 1410 Last data filed at 11/06/12 1200  Gross per 24 hour  Intake    855 ml  Output   1701 ml  Net   -846 ml   Filed Weights   11/04/12 0511 11/05/12 0528 11/06/12 0515  Weight: 65.454 kg (144 lb 4.8 oz) 65.091 kg (143 lb 8 oz) 65.499 kg (144 lb 6.4 oz)    Exam:   General:  NAD  Cardiovascular: Irregularly irregular, rate controlled  Respiratory: CTAB  Abdomen: Soft/NT/ND/+BS. Reducible ventral hernia  Extremities: No c/c/e  Data Reviewed: Basic Metabolic Panel:  Recent Labs Lab 11/02/12 0425 11/03/12 0850 11/04/12 0630 11/04/12 2220 11/05/12 0505 11/06/12 0420  NA 138 138 138  --  138 135  K 3.9 3.9 4.4  --  4.5 4.6  CL 104 104 103  --  102 102  CO2 26 22 21   --  24 26  GLUCOSE 71 138* 85  --  80 80  BUN 19 20 24*  --  29* 33*  CREATININE 1.20* 1.32* 1.28*  --  1.35* 1.35*  CALCIUM 8.3* 8.9 8.8  --  9.3 8.7  MG  --   --   --  1.7  --   --    Liver Function Tests: No results found for this basename: AST, ALT, ALKPHOS, BILITOT, PROT, ALBUMIN,  in the last 168 hours No results found for this basename: LIPASE, AMYLASE,  in the last 168 hours No results found for this basename: AMMONIA,  in the last 168 hours CBC:  Recent Labs Lab 11/01/12 0415 11/03/12 0850 11/04/12 0630 11/05/12 0505 11/06/12 0420  WBC 7.7 7.6 6.7 6.2 6.7  HGB 10.3* 11.2* 11.2* 11.3* 10.7*  HCT 31.5* 33.7* 34.1* 33.8* 32.0*  MCV 89.7 89.4 89.3 88.7 88.6  PLT 127* 128* 128* 141* 140*   Cardiac Enzymes:  Recent Labs Lab 11/04/12 2220 11/05/12 0505 11/05/12 0840  TROPONINI <0.30 <0.30 <0.30   BNP (last 3 results)  Recent Labs  11/01/12 0415 11/02/12 0425 11/03/12 0850  PROBNP 3886.0* 3265.0* 3330.0*   CBG: No results found for this basename: GLUCAP,  in the last 168 hours  No results  found for this or any previous visit (from the past 240 hour(s)).   Studies: No results found.  Scheduled Meds: . diltiazem  360 mg Oral Daily  . febuxostat  40 mg Oral Daily  . furosemide  40 mg Oral BID  . pantoprazole  40 mg Oral Q1200  . potassium chloride SA  10 mEq Oral BID  . Travoprost (BAK Free)  1 drop Both Eyes QHS  . warfarin  12.5 mg Oral ONCE-1800  . Warfarin - Pharmacist Dosing Inpatient   Does not apply q1800   Continuous Infusions: . heparin 800 Units/hr (11/06/12 1000)    Principal Problem:   SBO (small bowel obstruction) Active Problems:   Breast cancer   Chronic diastolic heart failure   Atrial fibrillation   Chronic anticoagulation   Chronic kidney disease, stage 3, mod decreased GFR   Hypertension   Ventral hernia   Acute diastolic heart failure    Time spent: 25 mins    Jonthan Leite C  [facility] Pager [phone]. If 7PM-7AM, please contact night-coverage at [email or web address], password [email or web address] 11/06/2012, 2:10 PM  LOS: 8 days

## 2012-11-06 NOTE — Progress Notes (Signed)
Patient HR drop in high 30s sustain in 40s , patient is asymptomatic, MD notified. cardizem  Order modify to 240 mg. See manage order. Will continue to monitor. Jannell Franta TijaniRN.

## 2012-11-07 LAB — CBC
HCT: 33.7 % — ABNORMAL LOW (ref 36.0–46.0)
Hemoglobin: 11.2 g/dL — ABNORMAL LOW (ref 12.0–15.0)
MCV: 88.2 fL (ref 78.0–100.0)
RBC: 3.82 MIL/uL — ABNORMAL LOW (ref 3.87–5.11)
RDW: 17 % — ABNORMAL HIGH (ref 11.5–15.5)
WBC: 6 10*3/uL (ref 4.0–10.5)

## 2012-11-07 LAB — PROTIME-INR: INR: 2.17 — ABNORMAL HIGH (ref 0.00–1.49)

## 2012-11-07 MED ORDER — WARFARIN SODIUM 1 MG PO TABS
11.0000 mg | ORAL_TABLET | Freq: Once | ORAL | Status: AC
  Administered 2012-11-07: 11 mg via ORAL
  Filled 2012-11-07: qty 1

## 2012-11-07 MED ORDER — DILTIAZEM HCL ER COATED BEADS 120 MG PO CP24
120.0000 mg | ORAL_CAPSULE | Freq: Every day | ORAL | Status: DC
  Administered 2012-11-08: 120 mg via ORAL
  Filled 2012-11-07: qty 1

## 2012-11-07 MED ORDER — WARFARIN SODIUM 2.5 MG PO TABS
12.5000 mg | ORAL_TABLET | Freq: Once | ORAL | Status: DC
  Filled 2012-11-07: qty 1

## 2012-11-07 NOTE — Progress Notes (Addendum)
[facility] PROGRESS NOTE  [name] MWU:[id number] DOB: 1934-03-07 DOA: 10/29/2012 PCP: Henri Medal, MD  Assessment/Plan: #1 small bowel obstruction Unclear etiology. Patient with prior surgeries. Clinically improved. Abdominal x-rays show resolution of small bowel obstruction with contrast in the rectum. Patient with BM. Patient tolerating current diet of solids.  -resolved  #2 acute on chronic diastolic CHF BNP was moderately elevated on admission at 5600. I/O = -1644/ 24 hrs.  2 d echo with EF 60-65% . Patient asymptomatic.  - continue oral lasix, she appears euvolemic #3 atrial fibrillation, with brady Cardizem for rate control. Change full dose Lovenox was changed to IV heparin until INR is therapeutic as patient has mitral valve repair. Continue Coumadin, INR therapeutic at 2.19, d/c heparin and follow -pt with brady down to upper 30s- 40 last pm 7/11- assymptomatic and today while asleep, will decrease cardizem 180mg (from 360) follow -cardiac enzymes neg -still with brady to through this am- cardizem held. Consulted cards and Dr Elease Hashimoto on call agrees with holding cardizem and to to resume at 120mg  in am,but states if pt develops tachy to callback CARDS in am. #4 hypertension Stable. decreased Cardizem dose as above- follow.  #5 chronic kidney disease stage III Stable.  #6 history of breast cancer Stable. In remission. Followup as outpatient.  #7 ventral hernia Patient to followup with general surgery as outpatient.  #8 prophylaxis PPI for GI prophylaxis. On full anticoagulation    PT/OT recommending no follow up  Code Status: Full Family Communication: daughter via phone. Disposition Plan: Home when INR is therapeutic and when medically stable.   Consultants:  None  Procedures:  CT abdomen and pelvis 10/31/2038  Abdominal x-ray 10/31/2012  2-D ECHO 11/02/2012  Antibiotics:  None  HPI/Subjective: Patient denies chest pain, no SOB.  Pt again with brady overnight down to 31-assymptomatic Objective: Filed Vitals:   11/06/12 1300 11/06/12 2130 11/07/12 0511 11/07/12 0900  BP: 145/41 152/45 149/94 143/42  Pulse: 55 48 59 56  Temp: 97.7 F (36.5 C) 97.3 F (36.3 C) 97.3 F (36.3 C) 97.7 F (36.5 C)  TempSrc: Oral Oral Oral Oral  Resp: 20 20 20 20   Height:      Weight:   65.1 kg (143 lb 8.3 oz)   SpO2: 100% 100% 99% 97%    Intake/Output Summary (Last 24 hours) at 11/07/12 1231 Last data filed at 11/07/12 1100  Gross per 24 hour  Intake    666 ml  Output   1850 ml  Net  -1184 ml   Filed Weights   11/05/12 0528 11/06/12 0515 11/07/12 0511  Weight: 65.091 kg (143 lb 8 oz) 65.499 kg (144 lb 6.4 oz) 65.1 kg (143 lb 8.3 oz)    Exam:   General:  NAD  Cardiovascular: Irregularly irregular, mildly brdycardic  Respiratory: CTAB  Abdomen: Soft/NT/ND/+BS. Reducible ventral hernia  Extremities: No c/c/e  Data Reviewed: Basic Metabolic Panel:  Recent Labs Lab 11/02/12 0425 11/03/12 0850 11/04/12 0630 11/04/12 2220 11/05/12 0505 11/06/12 0420  NA 138 138 138  --  138 135  K 3.9 3.9 4.4  --  4.5 4.6  CL 104 104 103  --  102 102  CO2 26 22 21   --  24 26  GLUCOSE 71 138* 85  --  80 80  BUN 19 20 24*  --  29* 33*  CREATININE 1.20* 1.32* 1.28*  --  1.35* 1.35*  CALCIUM 8.3* 8.9 8.8  --  9.3 8.7  MG  --   --   --  1.7  --   --    Liver Function Tests: No results found for this basename: AST, ALT, ALKPHOS, BILITOT, PROT, ALBUMIN,  in the last 168 hours No results found for this basename: LIPASE, AMYLASE,  in the last 168 hours No results found for this basename: AMMONIA,  in the last 168 hours CBC:  Recent Labs Lab 11/03/12 0850 11/04/12 0630 11/05/12 0505 11/06/12 0420 11/07/12 0345  WBC 7.6 6.7 6.2 6.7 6.0  HGB 11.2* 11.2* 11.3* 10.7* 11.2*  HCT 33.7* 34.1* 33.8* 32.0* 33.7*  MCV 89.4 89.3 88.7 88.6 88.2  PLT 128* 128* 141* 140* 158   Cardiac Enzymes:  Recent Labs Lab 11/04/12 2220  11/05/12 0505 11/05/12 0840  TROPONINI <0.30 <0.30 <0.30   BNP (last 3 results)  Recent Labs  11/01/12 0415 11/02/12 0425 11/03/12 0850  PROBNP 3886.0* 3265.0* 3330.0*   CBG: No results found for this basename: GLUCAP,  in the last 168 hours  No results found for this or any previous visit (from the past 240 hour(s)).   Studies: No results found.  Scheduled Meds: . diltiazem  180 mg Oral Daily  . febuxostat  40 mg Oral Daily  . furosemide  40 mg Oral BID  . pantoprazole  40 mg Oral Q1200  . potassium chloride SA  10 mEq Oral BID  . Travoprost (BAK Free)  1 drop Both Eyes QHS  . warfarin  11 mg Oral ONCE-1800  . Warfarin - Pharmacist Dosing Inpatient   Does not apply q1800   Continuous Infusions:    Principal Problem:   SBO (small bowel obstruction) Active Problems:   Breast cancer   Chronic diastolic heart failure   Atrial fibrillation   Chronic anticoagulation   Chronic kidney disease, stage 3, mod decreased GFR   Hypertension   Ventral hernia   Acute diastolic heart failure    Time spent: 25 mins    Catrell Morrone C  [facility] Pager [phone]. If 7PM-7AM, please contact night-coverage at [email or web address], password [facility] 11/07/2012, 12:31 PM  LOS: 9 days

## 2012-11-07 NOTE — Progress Notes (Signed)
Heparin d/c at  1142 per pharmacy order, no bleeding noted, will continue to monitor. Burr Medico RN

## 2012-11-07 NOTE — Progress Notes (Addendum)
ANTICOAGULATION CONSULT NOTE - Follow Up Consult  Pharmacy Consult for Heparin and Coumadin Indication: Afib and MV repair (per ECHO 7/13 and Cardiology note 11/21/11)  No Known Allergies  Patient Measurements: Height: 5\' 2"  (157.5 cm) Weight: 143 lb 8.3 oz (65.1 kg) IBW/kg (Calculated) : 50.1 Heparin Dosing Weight: 63.5kg  Vital Signs: Temp: 97.3 F (36.3 C) (07/13 0511) Temp src: Oral (07/13 0511) BP: 149/94 mmHg (07/13 0511) Pulse Rate: 59 (07/13 0511)  Labs:  Recent Labs  11/04/12 2220  11/05/12 0505 11/05/12 0840 11/06/12 0420 11/07/12 0345  HGB  --   < > 11.3*  --  10.7* 11.2*  HCT  --   --  33.8*  --  32.0* 33.7*  PLT  --   --  141*  --  140* 158  LABPROT  --   --  20.0*  --  21.5* 23.5*  INR  --   --  1.76*  --  1.93* 2.17*  HEPARINUNFRC  --   --  0.36  --  0.37 0.32  CREATININE  --   --  1.35*  --  1.35*  --   TROPONINI <0.30  --  <0.30 <0.30  --   --   < > = values in this interval not displayed.  Estimated Creatinine Clearance: 31.4 ml/min (by C-G formula based on Cr of 1.35).   Medications:  Heparin 800 units/hr  Assessment: 78yof on heparin bridging to Coumadin for Afib and hx MV repair (per ECHO 7/13 and Cardiology note 11/21/11). Heparin level (0.32) remains therapeutic. Discussed INR goal with Dr. Suanne Marker (MV repair vs replacement) - confirmed goal is 2-3 but will target upper end (2.5-3) with concomitant Afib. INR (2.17) is therapeutic and trending nicely with increased Coumadin doses.  Please note that patient is requiring much higher Coumadin doses than PTA (7.5mg  daily) - discharge regimen may need to be adjusted to maintain therapeutic INR. Now that INR is therapeutic, will discontinue heparin drip (confirmed with Dr. Suanne Marker). - H/H and Plts trending up - No significant bleeding reported  Goal of Therapy:  INR 2-3 Heparin level 0.3-0.7 units/ml Monitor platelets by anticoagulation protocol: Yes   Plan:  1. Coumadin 11 mg po x 1 today 2.  Discontinue heparin drip, heparin levels and protoc0l 3. Follow-up AM INR   Cleon Dew [phone] 11/07/2012,10:16 AM

## 2012-11-07 NOTE — Progress Notes (Signed)
Patient heart rate continues to sustain in the 40's and dropping into the 30's with a low of 31. Patient remains asymptomatic. [name] notified, will continue to monitor. [name]

## 2012-11-08 LAB — BASIC METABOLIC PANEL
BUN: 47 mg/dL — ABNORMAL HIGH (ref 6–23)
Chloride: 98 mEq/L (ref 96–112)
GFR calc non Af Amer: 30 mL/min — ABNORMAL LOW (ref >90)
Glucose, Bld: 82 mg/dL (ref 70–99)
Potassium: 4.6 mEq/L (ref 3.5–5.1)
Sodium: 133 mEq/L — ABNORMAL LOW (ref 135–145)

## 2012-11-08 LAB — CBC
HCT: 36.3 % (ref 36.0–46.0)
Hemoglobin: 12.1 g/dL (ref 12.0–15.0)
MCHC: 33.3 g/dL (ref 30.0–36.0)
RBC: 4.15 MIL/uL (ref 3.87–5.11)
WBC: 5.3 10*3/uL (ref 4.0–10.5)

## 2012-11-08 LAB — PROTIME-INR
INR: 2.47 — ABNORMAL HIGH (ref 0.00–1.49)
Prothrombin Time: 25.9 seconds — ABNORMAL HIGH (ref 11.6–15.2)

## 2012-11-08 MED ORDER — PANTOPRAZOLE SODIUM 40 MG PO TBEC: 40.0000 mg | DELAYED_RELEASE_TABLET | Freq: Every day | ORAL | Status: DC

## 2012-11-08 MED ORDER — WARFARIN SODIUM 7.5 MG PO TABS
7.5000 mg | ORAL_TABLET | Freq: Once | ORAL | Status: AC
  Administered 2012-11-08: 7.5 mg via ORAL
  Filled 2012-11-08 (×2): qty 1

## 2012-11-08 MED ORDER — DILTIAZEM HCL ER COATED BEADS 240 MG PO CP24: 240.0000 mg | ORAL_CAPSULE | Freq: Every day | ORAL | Status: DC

## 2012-11-08 NOTE — Progress Notes (Addendum)
ANTICOAGULATION CONSULT NOTE  Pharmacy Consult for Coumadin Indication: atrial fibrillation, MVRepair  No Known Allergies  Labs:  Recent Labs  11/06/12 0420 11/07/12 0345 11/08/12 0610  HGB 10.7* 11.2* 12.1  HCT 32.0* 33.7* 36.3  PLT 140* 158 163  LABPROT 21.5* 23.5* 25.9*  INR 1.93* 2.17* 2.47*  HEPARINUNFRC 0.37 0.32  --   CREATININE 1.35*  --  1.59*    Estimated Creatinine Clearance: 26.5 ml/min (by C-G formula based on Cr of 1.59).   Assessment: Patient is a 79 y.o F transferred to [facility] from [facility] with c/o abdominal pain and n/v. Patient is on coumadin PTA for afib and "heart valve repair." Home dose is 7.5mg  daily.  INR is 2.47 today (now trending up quickly after requiring large doses).    Goal of Therapy:  INR goal 2 to 3? Monitor platelets by anticoagulation protocol: Yes   Plan:  1) Coumadin 7.5 mg po x 1 2) Daily INR  Thank you. Okey Regal, PharmD [phone]   11/08/2012 9:04 AM

## 2012-11-08 NOTE — Discharge Summary (Addendum)
Physician Discharge Summary  SHONTELLE MUSKA EXB:[id number] DOB: 10-Aug-1933 DOA: 10/29/2012  PCP: Henri Medal, MD  Admit date: 10/29/2012 Discharge date: 11/08/2012  Time spent: >30 minutes  Recommendations for Outpatient Follow-up:      Follow-up Information   Follow up with Lesleigh Noe, MD. (on Friday 7/18, call for appt upon discharge)    Contact information:   [address] [place] [address] [phone]       Follow up with Jackalyn Lombard I, MD. (in 1-2weeks, call for appt upon discharge)       Please follow up. (Coumadin clinic/Dr Smith's office for PT/INR on 7/16, call for appt upon discharge)      Discharge Diagnoses:  Principal Problem:   SBO (small bowel obstruction) Active Problems:   Breast cancer   Chronic diastolic heart failure   Atrial fibrillation with Bradycardic episodes   Chronic anticoagulation   Chronic kidney disease, stage 3, mod decreased GFR   Hypertension   Ventral hernia   Acute diastolic heart failure   Discharge Condition: Improved/stable  Diet recommendation: Heart healthy  Filed Weights   11/06/12 0515 11/07/12 0511 11/08/12 0549  Weight: 65.499 kg (144 lb 6.4 oz) 65.1 kg (143 lb 8.3 oz) 64.139 kg (141 lb 6.4 oz)    History of present illness:  [name] is a 79 y.o. female  With past medical history diastolic heart failure and atrial fibrillation on anticoagulation who on 10/29/12 started having severe right lower quadrant abdominal pain associated with vomiting. Patient had noted some previous episodes of this in the weeks prior, but this time it was persistent and much more severe. She could not improve her pain and should she came into [facility] room for evaluation. Lab work noted a mild leukocytosis, stable creatinine and CT scan noted a small bowel obstruction. She was noted to also have a ventral hernia, however this was independent and unchanged from previous scan.  ER physician found this hernia to be reducible. Given these findings, it was felt best that she come in for admission. [facility] were contacted and accepted the patient and the patient was transferred to [facility]. Upon initial evaluation given history diastolic heart failure, BNP was checked and found to be elevated at 5600. Patient is asymptomatic of this.   Hospital Course:  #1 small bowel obstruction  Unclear etiology. Patient with prior surgeries. Patient was managed conservatively and Clinically improved. Abdominal x-rays show resolution of small bowel obstruction with contrast in the rectum. Patient with BM. Patient tolerating current diet of solids.  -resolved, she is to followup with surgery outpatient for hernia -see #7 below #2 acute on chronic diastolic CHF  BNP was moderately elevated on admission at 5600. I/O = -1644/ 24 hrs.  2 d echo with EF 60-65% . Patient asymptomatic.  - pt was maintained on Lasix this hospital stay but is to continue her torsemide upon discharge -Compensated, has remained asymptomatic during this hospital stay. #3 atrial fibrillation, with brady  Cardizem for rate control. Change full dose Lovenox was changed to IV heparin until INR is therapeutic as patient has mitral valve repair. Continue Coumadin, INR therapeutic at 2.19 on 7/13, heparin was discontinued and on followup today in her INR remains therapeutic at 2.47. She is to followup at Coumadin clinic on 7/16 PT INR and further dosing as appropriate -Patient while in hospital had brady down to upper 30s- 40 last pm 7/11- assymptomatic and today while asleep,  or Cardizem the dose was decreased, but on followup the next day (7/13) she was still bradycardic and so her Cardizem was held. -Consulted cards and Dr Elease Hashimoto on call agreed with holding cardizem and to to resume at 120mg  in am,but stated if pt develops tachy to callback CARDS in am.   -On followup today 7/14 patient's heart rate to  a.m. in the 70s and she received 120 mg of Cardizem and her heart rate has remained stable-mostly in the 70-76. -I discussed patient with Dr. Verdis Prime regarding close outpatient  monitoring upon discharge and he recommends to discharge her on Cardizem 240 mg and have her followup with him on Friday 7/18 -10 had cardiac enzymes cycled during this hospital stay and they came back negative, 2-D echocardiogram was done and the results as stated above with EF 60-65%   #4 hypertension  Stable. decreased Cardizem dose as above- follow up with Dr. Katrinka Blazing outpatient the  #5 chronic kidney disease stage III  Stable.  #6 history of breast cancer  Stable. In remission. Followup as outpatient.  #7 ventral hernia  Patient to followup with general surgery as outpatient.  Consultants:  Dr. Elease Hashimoto  via phone 7/13 Procedures:  CT abdomen and pelvis 10/31/2038  Abdominal x-ray 10/31/2012  2-D ECHO 11/02/2012     Discharge Exam: Filed Vitals:   11/07/12 1300 11/07/12 2055 11/08/12 0549 11/08/12 1405  BP: 131/54 112/61 151/49 126/57  Pulse: 58 69 70 76  Temp: 98.1 F (36.7 C) 97.5 F (36.4 C) 97.9 F (36.6 C) 97.5 F (36.4 C)  TempSrc: Oral Oral Oral Oral  Resp: 20 17 18 17   Height:      Weight:   64.139 kg (141 lb 6.4 oz)   SpO2: 98% 94% 99% 97%   Exam:  General: NAD  Cardiovascular: Irregularly irregular, rate controlled  Respiratory: CTAB  Abdomen: Soft/NT/ND/+BS. Reducible ventral hernia  Extremities: No c/c/e   Discharge Instructions  Discharge Orders   Future Appointments Provider Department Dept Phone   11/18/2012 11:30 AM Ernestene Mention, MD [facility] [place] [phone]   12/08/2012 2:00 PM Windell Hummingbird [facility] MEDICAL ONCOLOGY [phone]   12/13/2012 1:30 PM Lowella Dell, MD [facility] MEDICAL ONCOLOGY [phone]   Future Orders Complete By Expires     Diet - low sodium heart healthy  As directed     Increase  activity slowly  As directed         Medication List         acetaminophen 325 MG tablet  Commonly known as:  TYLENOL  Take 325 mg by mouth every 6 (six) hours as needed for pain.     [employer] CALCIUM 600 + D PO  Take by mouth 2 (two) times daily.     diltiazem 240 MG 24 hr capsule  Commonly known as:  CARDIZEM CD  Take 1 capsule (240 mg total) by mouth daily.     febuxostat 40 MG tablet  Commonly known as:  ULORIC  Take 40 mg by mouth daily.     pantoprazole 40 MG tablet  Commonly known as:  PROTONIX  Take 1 tablet (40 mg total) by mouth daily at 12 noon.     potassium chloride SA 20 MEQ tablet  Commonly known as:  K-DUR,KLOR-CON  Take 10 mEq by mouth 2 (two) times daily.     torsemide 20 MG tablet  Commonly known as:  DEMADEX  Take 40 mg by  mouth daily.     TRAVATAN Z 0.004 % Soln ophthalmic solution  Generic drug:  Travoprost (BAK Free)  Place 1 drop into both eyes at bedtime.     warfarin 2.5 MG tablet  Commonly known as:  COUMADIN  Take 5-7.5 mg by mouth daily. 2 tablets daily except for 3 tablets on Monday, Wednesday, Friday.       No Known Allergies     Follow-up Information   Follow up with Lesleigh Noe, MD. (on Friday 7/18, call for appt upon discharge)    Contact information:   [address] [place] [address] [phone]       Follow up with Jackalyn Lombard I, MD. (in 1-2weeks, call for appt upon discharge)       Please follow up. (Coumadin clinic/Dr Smith's office for PT/INR on 7/16, call for appt upon discharge)        The results of significant diagnostics from this hospitalization (including imaging, microbiology, ancillary and laboratory) are listed below for reference.    Significant Diagnostic Studies: Dg Abd 1 View  10/31/2012   *RADIOLOGY REPORT*  Clinical Data: Follow up small bowel obstruction  ABDOMEN - 1 VIEW  Comparison: 10/31/2012  Findings: Enteric contrast material is again noted within the left side  of colon and rectum.  No dilated small bowel loops or air fluid levels identified.  IMPRESSION:  1.  Stable bowel gas pattern.  No obstructive features identified.   Original Report Authenticated By: Signa Kell, M.D.   Ct Abdomen Pelvis W Contrast  10/30/2012   *RADIOLOGY REPORT*  Clinical Data: Abdominal pain for several weeks with sudden onset of lower abdominal cramping tonight. History of umbilical and right lower quadrant hernias.  CT ABDOMEN AND PELVIS WITH CONTRAST  Technique:  Multidetector CT imaging of the abdomen and pelvis was performed following the standard protocol during bolus administration of intravenous contrast.  Contrast: OMNIPAQUE IOHEXOL 300 MG/ML  SOLN  Comparison: 12/19/2010  Findings: Atelectasis or infiltration in both lung bases.  Small esophageal hiatal hernia with contrast material in the distal esophagus suggesting reflux or dysmotility.  Filling defect in the lower esophagus could represent a polyp or ingested material. Diffuse cardiac enlargement.  Postoperative change in the mediastinum.  Reversed flow of contrast material from the heart into the hepatic veins consistent with passive hepatic congestion. Multiple hepatic cysts, largest in the lateral segment left lobe measuring 4 cm diameter and stable since previous study.  Large stone in the gallbladder.  No gallbladder wall thickening.  No bile duct dilatation.  The spleen, pancreas, adrenal glands, and inferior vena cava are unremarkable.  Nonobstructing stones in the lower pole of the right kidney.  Bilateral renal cysts are stable.  No retroperitoneal lymphadenopathy.  Small amount of fluid in the upper abdomen around the liver and spleen.  Density measurements are consistent with ascites.  Right lower quadrant anterior abdominal wall hernia to the right of midline at the level of the umbilicus containing fluid and fatty infiltration suggesting fat necrosis.  Minimal fat containing umbilical hernia.  The hernia is  appear stable since the previous study.  There is proximal small bowel distension with fluid filled loops.  The distal small bowel is decompressed.  Changes are consistent with small bowel obstruction.  Transition zone appears to be in the mid pelvis. Suggestion of mild wall thickening at the level of This does not appear to be associated with the hernia and probably represents adhesions. Inflammatory bowel disease  is not excluded.  Stool filled colon without distension or wall thickening.  Pelvis:  Small amount of free fluid in the pelvis.  Uterus and ovaries are not enlarged.  Bladder wall is not thickened.  No significant lymphadenopathy in the pelvis.  Appendix is surgically absent by history.  Degenerative changes throughout the spine with mild scoliosis.  Compression of T11 and T12 are stable.  IMPRESSION: Fluid-filled distended small bowel with transition zone in the pelvis consistent with small bowel obstruction.  Small amount of ascites.  Stable appearance of hepatic and renal cysts.  Stable appearance of anterior abdominal wall hernias.   Original Report Authenticated By: Burman Nieves, M.D.   Dg Abd Portable 1v  10/31/2012   *RADIOLOGY REPORT*  Clinical Data: Follow up small bowel obstruction  PORTABLE ABDOMEN - 1 VIEW  Comparison: CT abdomen/pelvis 10/30/2012  Findings: Interval resolution of small bowel obstruction.  No dilated loops of small bowel are identified.  There is been distal progression of oral contrast material into the colon all the way to the rectum.  Marked rotary dextroconvex scoliosis of the lumbar spine.  Cholelithiasis.  Atelectasis versus scarring in the left lower lobe.  IMPRESSION: Interval resolution of small bowel obstruction with progression of oral contrast material into the colon and rectum.   Original Report Authenticated By: Malachy Moan, M.D.    Microbiology: No results found for this or any previous visit (from the past 240 hour(s)).   Labs: Basic Metabolic  Panel:  Recent Labs Lab 11/03/12 0850 11/04/12 0630 11/04/12 2220 11/05/12 0505 11/06/12 0420 11/08/12 0610  NA 138 138  --  138 135 133*  K 3.9 4.4  --  4.5 4.6 4.6  CL 104 103  --  102 102 98  CO2 22 21  --  24 26 25   GLUCOSE 138* 85  --  80 80 82  BUN 20 24*  --  29* 33* 47*  CREATININE 1.32* 1.28*  --  1.35* 1.35* 1.59*  CALCIUM 8.9 8.8  --  9.3 8.7 9.3  MG  --   --  1.7  --   --   --    Liver Function Tests: No results found for this basename: AST, ALT, ALKPHOS, BILITOT, PROT, ALBUMIN,  in the last 168 hours No results found for this basename: LIPASE, AMYLASE,  in the last 168 hours No results found for this basename: AMMONIA,  in the last 168 hours CBC:  Recent Labs Lab 11/04/12 0630 11/05/12 0505 11/06/12 0420 11/07/12 0345 11/08/12 0610  WBC 6.7 6.2 6.7 6.0 5.3  HGB 11.2* 11.3* 10.7* 11.2* 12.1  HCT 34.1* 33.8* 32.0* 33.7* 36.3  MCV 89.3 88.7 88.6 88.2 87.5  PLT 128* 141* 140* 158 163   Cardiac Enzymes:  Recent Labs Lab 11/04/12 2220 11/05/12 0505 11/05/12 0840  TROPONINI <0.30 <0.30 <0.30   BNP: BNP (last 3 results)  Recent Labs  11/01/12 0415 11/02/12 0425 11/03/12 0850  PROBNP 3886.0* 3265.0* 3330.0*   CBG: No results found for this basename: GLUCAP,  in the last 168 hours     Signed:  Kela Millin  [facility] 11/08/2012, 4:34 PM

## 2012-11-08 NOTE — Progress Notes (Signed)
Physical Therapy Treatment Patient Details Name: [name] MRN: [id number] DOB: [birth year] Today's Date: 11/08/2012 Time: 1202-1215 PT Time Calculation (min): 13 min  PT Assessment / Plan / Recommendation  PT Comments   Pt progressing well towards all goals. Pt with no episodes of LOB, SOB or abdominal pain during ambulation. Pt functioning near baseline. Freq dec to 2x/wk. Pt motivated and desires to return home ASAP.   Follow Up Recommendations  No PT follow up     Does the patient have the potential to tolerate intense rehabilitation     Barriers to Discharge        Equipment Recommendations  None recommended by PT    Recommendations for Other Services    Frequency Min 2X/week   Progress towards PT Goals Progress towards PT goals: Progressing toward goals  Plan Frequency needs to be updated    Precautions / Restrictions Restrictions Weight Bearing Restrictions: No   Pertinent Vitals/Pain Denies pain    Mobility  Bed Mobility Bed Mobility: Not assessed Transfers Transfers: Sit to Stand;Stand to Sit Sit to Stand: 5: Supervision;With upper extremity assist;With armrests Stand to Sit: 5: Supervision;Without upper extremity assist;With armrests;To chair/3-in-1 Details for Transfer Assistance: v/c's to reach back for chair to slowly lower self to sitting Ambulation/Gait Ambulation/Gait Assistance: 5: Supervision Ambulation Distance (Feet): 200 Feet Assistive device: Rolling walker Ambulation/Gait Assistance Details: v/c's to stay in walker Gait Pattern: Step-through pattern Gait velocity: wfl General Gait Details: pt requires use of RW for safe ambulation Stairs: No    Exercises     PT Diagnosis:    PT Problem List:   PT Treatment Interventions:     PT Goals (current goals can now be found in the care plan section) Acute Rehab PT Goals Patient Stated Goal: To go home soon  Visit Information  Last PT Received On: 11/08/12 Assistance Needed:  +1 History of Present Illness: Abdominal pain. With past medical history diastolic heart failure and atrial fibrillation on anticoagulation who on 10/29/12 started having severe right lower quadrant abdominal pain associated with vomiting. Patient had noted some previous episodes of this in the weeks prior, but this time it was persistent and much more severe. She could not improve her pain and should she came into [facility] room for evaluation. Lab work noted a mild leukocytosis, stable creatinine and CT scan noted a small bowel obstruction. She was noted to also have a ventral hernia, however this was independent and unchanged from previous scan. ER physician found this hernia to be reducible. Given these findings, it was felt best that she come in for admission. [facility] hospitalists were contacted and accepted the patient and the patient was transferred to [facility]. Upon initial evaluation given history diastolic heart failure, BNP was checked and found to be elevated at 5600. Patient is asymptomatic of this.    Subjective Data  Patient Stated Goal: To go home soon   Cognition  Cognition Arousal/Alertness: Awake/alert Behavior During Therapy: WFL for tasks assessed/performed Overall Cognitive Status: Within Functional Limits for tasks assessed    Balance     End of Session PT - End of Session Equipment Utilized During Treatment: Gait belt Activity Tolerance: Patient tolerated treatment well Patient left: in chair;with call bell/phone within reach Nurse Communication: Mobility status   GP     Marcene Brawn 11/08/2012, 12:28 PM  Lewis Shock, PT, DPT Pager #: [phone] Office #: [phone]

## 2012-11-08 NOTE — Progress Notes (Signed)
[name] to be D/C'd Home per MD order.  Discussed with the patient and all questions fully answered.    Medication List         acetaminophen 325 MG tablet  Commonly known as:  TYLENOL  Take 325 mg by mouth every 6 (six) hours as needed for pain.     [employer] CALCIUM 600 + D PO  Take by mouth 2 (two) times daily.     diltiazem 240 MG 24 hr capsule  Commonly known as:  CARDIZEM CD  Take 1 capsule (240 mg total) by mouth daily.     febuxostat 40 MG tablet  Commonly known as:  ULORIC  Take 40 mg by mouth daily.     pantoprazole 40 MG tablet  Commonly known as:  PROTONIX  Take 1 tablet (40 mg total) by mouth daily at 12 noon.     potassium chloride SA 20 MEQ tablet  Commonly known as:  K-DUR,KLOR-CON  Take 10 mEq by mouth 2 (two) times daily.     torsemide 20 MG tablet  Commonly known as:  DEMADEX  Take 40 mg by mouth daily.     TRAVATAN Z 0.004 % Soln ophthalmic solution  Generic drug:  Travoprost (BAK Free)  Place 1 drop into both eyes at bedtime.     warfarin 2.5 MG tablet  Commonly known as:  COUMADIN  Take 5-7.5 mg by mouth daily. 2 tablets daily except for 3 tablets on Monday, Wednesday, Friday.        VVS, Skin clean, dry and intact without evidence of skin break down, no evidence of skin tears noted. IV catheter discontinued intact. Site without signs and symptoms of complications. Dressing and pressure applied.  An After Visit Summary was printed and given to the patient. Patient escorted via WC, and D/C home via private auto.  Remus Hagedorn 11/08/2012 8:10 PM

## 2012-11-18 ENCOUNTER — Ambulatory Visit (INDEPENDENT_AMBULATORY_CARE_PROVIDER_SITE_OTHER): Payer: Medicare Other | Admitting: General Surgery

## 2012-11-18 ENCOUNTER — Encounter (INDEPENDENT_AMBULATORY_CARE_PROVIDER_SITE_OTHER): Payer: Self-pay | Admitting: General Surgery

## 2012-11-18 VITALS — BP 190/80 | HR 66 | Resp 14 | Ht 61.75 in | Wt 142.6 lb

## 2012-11-18 DIAGNOSIS — C50919 Malignant neoplasm of unspecified site of unspecified female breast: Secondary | ICD-10-CM

## 2012-11-18 DIAGNOSIS — C50911 Malignant neoplasm of unspecified site of right female breast: Secondary | ICD-10-CM

## 2012-11-18 NOTE — Progress Notes (Signed)
Patient ID: [name], female   DOB: [birth year], 79 y.o.   MRN: [id number] History: This patient returns for long-term followup regarding her right breast cancer. On 11/16/2010 she underwent a right partial mastectomy for a triple negative breast cancer, T1c,N0.  We did not perform a sentinel lymph node because her comorbidities were 2 significant for her to consider chemotherapy. Radiation therapy was completed in October 2012. She has no known returns today. She is followed by Dr. Marikay Alar Magrinat and Dr. Verdis Prime. Recent mammograms at [facility] on 10/01/2012 are Category 2, no focal abnormality bilaterally. She has no complaints about her breasts.  ROS: 10 system review of systems is negative except as described above.  Family history, social history are unchanged from last year  Exam: Patient is alert. Ambulates  with a cane. No distress Neck: No adenopathy mass or JV distention Lungs: Clear auscultation. Well-healed sternotomy scar Heart: Regular rate and rhythm. Aortic sounds prominent Breast: Right breast shows radiation change with reduction in size some mild skin thickening and tanning, is nontender. The breast is soft. No palpable mass or axillary adenopathy. Good range of motion right shoulder. Left breast is atrophic and ptotic, no palpable mass or skin change or adenopathy.  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  No evidence of recurrence two years following right partial mastectomy, needle localization and adjuvant radiation therapy.  Atrial fibrillation on Coumadin  Status post aortic valve replacement  Congestive heart failure  Chronic renal insufficiency.  Plan: Return to see me in one year after she gets her annual mammograms Continue regular followup with Dr. Darnelle Catalan, her primary care physician, and her cardiologist.   [name]. [name], M.D., [facility] General and Minimally invasive Surgery Breast and Colorectal Surgery Office:   [phone] Pager:   [phone]

## 2012-11-18 NOTE — Patient Instructions (Signed)
Your breast exam and your recent mammograms are normal. There is no evidence of breast cancer.  Return to see Dr. [name] in one year, after you get the mammograms.

## 2012-11-25 ENCOUNTER — Other Ambulatory Visit (HOSPITAL_COMMUNITY): Payer: Self-pay | Admitting: Internal Medicine

## 2012-11-25 NOTE — Telephone Encounter (Signed)
Refill on Cardizem

## 2012-12-08 ENCOUNTER — Other Ambulatory Visit: Payer: Medicare Other | Admitting: Lab

## 2012-12-13 ENCOUNTER — Ambulatory Visit (HOSPITAL_BASED_OUTPATIENT_CLINIC_OR_DEPARTMENT_OTHER): Payer: Medicare Other | Admitting: Oncology

## 2012-12-13 ENCOUNTER — Telehealth: Payer: Self-pay | Admitting: *Deleted

## 2012-12-13 VITALS — BP 153/74 | HR 80 | Temp 98.3°F | Resp 20 | Ht 61.75 in | Wt 150.8 lb

## 2012-12-13 DIAGNOSIS — C50419 Malignant neoplasm of upper-outer quadrant of unspecified female breast: Secondary | ICD-10-CM

## 2012-12-13 DIAGNOSIS — C50919 Malignant neoplasm of unspecified site of unspecified female breast: Secondary | ICD-10-CM

## 2012-12-13 DIAGNOSIS — Z171 Estrogen receptor negative status [ER-]: Secondary | ICD-10-CM

## 2012-12-13 DIAGNOSIS — Z954 Presence of other heart-valve replacement: Secondary | ICD-10-CM

## 2012-12-13 DIAGNOSIS — I10 Essential (primary) hypertension: Secondary | ICD-10-CM

## 2012-12-13 NOTE — Telephone Encounter (Signed)
appts made and printed...td 

## 2012-12-13 NOTE — Progress Notes (Signed)
ID: [name]   DOB: [birth year]  MR#: [id number]  QMV#:[id number]  PCP: Henri Medal, MD GYN: SUClaud Kelp OTHER MD: Verdis Prime, Chipper Herb   HISTORY OF PRESENT ILLNESS: The patient had routine yearly screening mammography Sep 24, 2010, at [place].  This suggested a possible asymmetry in the right breast, so the patient proceeded to additional views October 01, 2010.  The mass noted in screening persisted on additional images.  There were irregular margins.  By ultrasonography, this was an irregular hypoechoic mass measuring 1.5 cm.  There was no evidence of axillary adenopathy.  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  The MIB-1 was 90%.   With this information, the patient was referred to Dr. [name] and bilateral breast MRIs were obtained October 15, 2010, at [facility].  This confirmed a 1.4-cm irregular enhancing mass in the upper central right breast.  There were no other suspicious areas, no axillary or internal mammary chain adenopathy, and no suspicious enhancement or changes in the left breast.  Incidental note is of a 4.5-cm left hepatic cyst and 2 smaller likely liver cysts were noted. Her subsequent history is as detailed below.  INTERVAL HISTORY: Heiley returns today accompanied by her daughter for followup of Charolette's breast cancer. Since her last visit here she had a 9 day admission for small bowel obstruction, felt secondary to adhesions. CT of the abdomen and pelvis obtained at that time (first week in July) showed no evidence of metastatic disease.  REVIEW OF SYSTEMS: Carleigh is doing "well". Of course she has her irregular heartbeat, but she has had no chest pain or pressure and no shortness of breath problems. She is having normal bowel movements or location oh she  will have a hard 1. There has been no blood and no diarrhea. She denies abdominal cramps. A detailed review of systems today was otherwise stable   PAST MEDICAL HISTORY: Past Medical History  Diagnosis Date  . Hypertension   . GERD (gastroesophageal reflux disease)   . Glaucoma   . Cataract   . Breast cancer     right  . Valvular heart disease   . Hyperlipidemia   . Gout   . Shortness of breath   . Heart murmur   . CHF (congestive heart failure)   Significant for cardiac valve repair/replacement in 2001.  I do not have those records.  The patient has been on Coumadin since that time, followed through [name].  She has cardiomegaly by MRI.  She has a history of hypertension, cataracts, glaucoma, and she has problems with ambulation due to damage to her feet, felt to be secondary to working on a concrete floor for many years with inadequate shoes.  She is status post appendectomy and status post bilateral tubal ligation.  There is also a history of atrial fibrillation and history of diastolic dysfunction with some evidence of failure, history of hyperlipidemia, history of chronic venous insufficiency, and history of acute renal failure (please refer to the admission history by Dr. [name] in e-Chart).  PAST SURGICAL HISTORY: Past Surgical History  Procedure Laterality Date  . Cardiac valve replacement  2001    per medical history form dated 10/21/10  . Appendectomy  1980's    Per patient son. not sure of date.  . Tubal ligation    .  Breast surgery  2012    Right br. Lumpectomy    FAMILY HISTORY Family History  Problem Relation Age of Onset  . Hypertension Mother   . Other Mother     Alzheimers  . Cancer Father     leukemia  . Hypertension Brother   . Other Brother     breathing problems  The patient's mother died age 58 with ALZ disease.  The patient's father died from leukemia at the age of 42.  The patient had no sisters.  She had 3 brothers.  One died, but she does  not know the cause.  The other 2 are living; 1 has emphysema, 1 has gout and high blood pressure.  GYNECOLOGIC HISTORY: She is Gx P6.  First pregnancy to term age 79.  She does not remember when she had menarche.  She went through the change of life around age 84.  She never took hormone replacement.  SOCIAL HISTORY: She used to work as a Arboriculturist at [employer].  She is widowed, her husband dying in 2011 from emphysema problems.  At home her son, Gay Filler sometimes stays.  He is disabled secondary to burns.  With her today is her son, [name], who works as a Curator.  His cell number is [phone].  The patient's daughter, [name] works in Designer, fashion/clothing.  Her home number is [phone].  In case of an emergency, Ms. Bittinger would want [place] to contact one or both of them.  Ms. Feldmeier has 5 grandchildren.  She attends [employer].   ADVANCED DIRECTIVES: In place  HEALTH MAINTENANCE: History  Substance Use Topics  . Smoking status: Never Smoker   . Smokeless tobacco: Never Used  . Alcohol Use: No     Colonoscopy:  PAP:  Bone density:  Lipid panel:  No Known Allergies  Current Outpatient Prescriptions  Medication Sig Dispense Refill  . acetaminophen (TYLENOL) 325 MG tablet Take 325 mg by mouth every 6 (six) hours as needed for pain.      . Calcium Carbonate-Vitamin D ([employer] CALCIUM 600 + D PO) Take by mouth 2 (two) times daily.        Marland Kitchen diltiazem (CARDIZEM CD) 240 MG 24 hr capsule TAKE ONE CAPSULE BY MOUTH EVERY DAY  30 capsule  0  . febuxostat (ULORIC) 40 MG tablet Take 40 mg by mouth daily.      . pantoprazole (PROTONIX) 40 MG tablet Take 1 tablet (40 mg total) by mouth daily at 12 noon.  30 tablet  0  . torsemide (DEMADEX) 20 MG tablet Take 40 mg by mouth daily.       . TRAVATAN Z 0.004 % SOLN ophthalmic solution Place 1 drop into both eyes at bedtime.       Marland Kitchen warfarin (COUMADIN) 2.5 MG tablet Take 5-7.5 mg by mouth daily. 2 tablets daily except for 3  tablets on Monday, Wednesday, Friday.       No current facility-administered medications for this visit.    OBJECTIVE: Elderly African American woman in no acute distress Filed Vitals:   12/13/12 1340  BP: 153/74  Pulse: 80  Temp: 98.3 F (36.8 C)  Resp: 20     Body mass index is 27.82 kg/(m^2).    ECOG FS: 2 Filed Weights   12/13/12 1340  Weight: 150 lb 12.8 oz (68.402 kg)    Sclerae unicteric, pupils equal round and reactive to light Oropharynx clear No cervical or supraclavicular adenopathy Lungs no rales  or rhonchi, fair excursion bilaterally Heart regular rate,   2/6 mid systolic murmur noted Abdomen soft, nontender, positive bowel sounds, no masses palpated MSK no focal spinal tenderness to gentle palpation Nonpitting ankle edema bilaterally, no upper extremity edema noted Neuro: nonfocal, well oriented, positive affect Breasts: The right breast is status post lumpectomy and radiation.. There is no evidence of local recurrence. The right axilla is benign. Left breast is unremarkable.   LAB RESULTS: Lab Results  Component Value Date   WBC 5.3 11/08/2012   NEUTROABS 10.9* 10/30/2012   HGB 12.1 11/08/2012   HCT 36.3 11/08/2012   MCV 87.5 11/08/2012   PLT 163 11/08/2012      Chemistry      Component Value Date/Time   NA 133* 11/08/2012 0610   K 4.6 11/08/2012 0610   CL 98 11/08/2012 0610   CO2 25 11/08/2012 0610   BUN 47* 11/08/2012 0610   CREATININE 1.59* 11/08/2012 0610      Component Value Date/Time   CALCIUM 9.3 11/08/2012 0610   ALKPHOS 167* 10/30/2012 0024   AST 23 10/30/2012 0024   ALT 16 10/30/2012 0024   BILITOT 0.7 10/30/2012 0024       Lab Results  Component Value Date   LABCA2 32 10/24/2010     STUDIES:   Mammography at [facility] 10/01/2012 was unremarkable.  CT abd/pelvis 10/29/2012 showed SBO but no metastatic disease    ASSESSMENT: 79 y.o. [place] woman   (1)  status post right breast upper central lumpectomy with no sentinel lymph node sampling  July 2012 for a T1c NX high-grade invasive ductal carcinoma, triple negative, with an MIB-1 of 90%.   (2)  She completed adjuvant radiation October 2012.  (3) Because of her comorbidities and age, the Adjuvant! Program would calculate her benefit from chemotherapy in the 2% range even if she had positive lymph nodes.  For this reason, we did not think chemotherapy would be useful for her in the adjuvant setting and we did not request sentinel lymph node information or port placement.   PLAN: [place] is doing fine as far as her breast cancer is concerned. She is now 2 years out from her definitive surgery with no evidence of disease recurrence. We're going to start seeing her on a once a year basis, every August, after her mammogram in June or July. She knows to call for any problems that she thinks may be related to her breast cancer history.  Chrystal Zeimet C    12/13/2012

## 2013-01-01 ENCOUNTER — Other Ambulatory Visit (HOSPITAL_COMMUNITY): Payer: Self-pay | Admitting: Internal Medicine

## 2013-01-03 NOTE — Telephone Encounter (Signed)
Medication refill- protoni

## 2013-02-01 ENCOUNTER — Ambulatory Visit (INDEPENDENT_AMBULATORY_CARE_PROVIDER_SITE_OTHER): Payer: Medicare Other | Admitting: Pharmacist

## 2013-02-01 DIAGNOSIS — I4891 Unspecified atrial fibrillation: Secondary | ICD-10-CM

## 2013-02-07 ENCOUNTER — Other Ambulatory Visit: Payer: Self-pay | Admitting: Interventional Cardiology

## 2013-02-11 ENCOUNTER — Other Ambulatory Visit: Payer: Self-pay | Admitting: Interventional Cardiology

## 2013-02-15 ENCOUNTER — Other Ambulatory Visit: Payer: Self-pay

## 2013-02-15 ENCOUNTER — Other Ambulatory Visit: Payer: Self-pay | Admitting: Interventional Cardiology

## 2013-03-01 ENCOUNTER — Ambulatory Visit (INDEPENDENT_AMBULATORY_CARE_PROVIDER_SITE_OTHER): Payer: Medicare Other | Admitting: Pharmacist

## 2013-03-01 ENCOUNTER — Encounter: Payer: Self-pay | Admitting: Interventional Cardiology

## 2013-03-01 ENCOUNTER — Ambulatory Visit (INDEPENDENT_AMBULATORY_CARE_PROVIDER_SITE_OTHER): Payer: Medicare Other | Admitting: Interventional Cardiology

## 2013-03-01 VITALS — BP 164/68 | HR 80 | Ht 60.5 in | Wt 151.8 lb

## 2013-03-01 DIAGNOSIS — Z9889 Other specified postprocedural states: Secondary | ICD-10-CM

## 2013-03-01 DIAGNOSIS — Z23 Encounter for immunization: Secondary | ICD-10-CM

## 2013-03-01 DIAGNOSIS — Z0000 Encounter for general adult medical examination without abnormal findings: Secondary | ICD-10-CM

## 2013-03-01 DIAGNOSIS — I5032 Chronic diastolic (congestive) heart failure: Secondary | ICD-10-CM

## 2013-03-01 DIAGNOSIS — I4891 Unspecified atrial fibrillation: Secondary | ICD-10-CM

## 2013-03-01 DIAGNOSIS — I10 Essential (primary) hypertension: Secondary | ICD-10-CM

## 2013-03-01 DIAGNOSIS — Z7901 Long term (current) use of anticoagulants: Secondary | ICD-10-CM

## 2013-03-01 LAB — POCT INR: INR: 1.7

## 2013-03-01 NOTE — Progress Notes (Signed)
Patient ID: [name], female   DOB: [birth year], 79 y.o.   MRN: [id number]    [address] [place], [place]  [address] Phone: [phone] Fax:  [phone]  Date:  03/01/2013   ID:  [name], DOB 08-May-1933, MRN [id number]  PCP:  Henri Medal, MD   ASSESSMENT:  1. Chronic diastolic heart failure, clinically compensated without evidence of volume overload 2. Chronic atrial fibrillation with excellent rate control 3. Chronic anticoagulation therapy without bleeding 4. Blood pressure and adequate control  PLAN:  1. Continue to monitor weight at home. She is to call if there is greater than a 3 pound ovarian center baseline weight at home which is 142 pounds. The critical weight here in the office is 150 pounds.  2. No change in diuretic therapy  3. 3 mild clinical appointment   SUBJECTIVE: [name] is a 79 y.o. female is doing well. She denies angina. No significant lower extremity swelling. She has not had syncope. Appetite is good. She denies chills, fever, and other complaints .   Wt Readings from Last 3 Encounters:  03/01/13 151 lb 12.8 oz (68.856 kg)  12/13/12 150 lb 12.8 oz (68.402 kg)  11/18/12 142 lb 9.6 oz (64.683 kg)     Past Medical History  Diagnosis Date  . Hypertension   . GERD (gastroesophageal reflux disease)   . Glaucoma   . Cataract   . Breast cancer     right  . Valvular heart disease   . Hyperlipidemia   . Gout   . Shortness of breath   . Heart murmur   . CHF (congestive heart failure)     Current Outpatient Prescriptions  Medication Sig Dispense Refill  . acetaminophen (TYLENOL) 325 MG tablet Take 325 mg by mouth every 6 (six) hours as needed for pain.      Marland Kitchen allopurinol (ZYLOPRIM) 100 MG tablet Take 100 mg by mouth daily.       . Calcium Carbonate-Vitamin D ([employer] CALCIUM 600 + D PO) Take by mouth 2 (two) times daily.        Marland Kitchen diltiazem (CARDIZEM CD) 240 MG 24 hr capsule TAKE ONE CAPSULE BY MOUTH  EVERY DAY  30 capsule  0  . diltiazem (TIAZAC) 360 MG 24 hr capsule Take 360 mg by mouth daily.       . febuxostat (ULORIC) 40 MG tablet Take 40 mg by mouth daily.      . pantoprazole (PROTONIX) 40 MG tablet TAKE 1 TABLET BY MOUTH EVERY DAY AT 12 NOON  30 tablet  0  . potassium chloride SA (K-DUR,KLOR-CON) 20 MEQ tablet TAKE 1 TABLET TWICE DAILY  60 tablet  7  . torsemide (DEMADEX) 20 MG tablet TAKE 2 TABLETS BY MOUTH EVERY DAY  60 tablet  6  . TRAVATAN Z 0.004 % SOLN ophthalmic solution Place 1 drop into both eyes at bedtime.       Marland Kitchen warfarin (COUMADIN) 2.5 MG tablet Take 5-7.5 mg by mouth daily. 2 tablets daily except for 3 tablets on Monday, Wednesday, Friday.       No current facility-administered medications for this visit.    Allergies:   No Known Allergies  Social History:  The patient  reports that she has never smoked. She has never used smokeless tobacco. She reports that she does not drink alcohol or use illicit drugs.   ROS:  Please see the history of present illness.    All other  systems reviewed and negative.   OBJECTIVE: VS:  BP 164/68  Pulse 80  Ht 5' 0.5" (1.537 m)  Wt 151 lb 12.8 oz (68.856 kg)  BMI 29.15 kg/m2 Well nourished, well developed, in no acute distress, frail HEENT: normal Neck: JVD moderate with the patient sitting and a prominent V wave. Carotid bruit absent  Cardiac:  normal S1, S2; RRR; no murmur Lungs:  clear to auscultation bilaterally, no wheezing, rhonchi or rales Abd: soft, nontender, no hepatomegaly Ext: Edema lower extremity lichenification, bilateral. 1+ bilateral edema.. Pulses trace to 1+ bilateral in both feet Skin: warm and dry Neuro:  CNs 2-12 intact, no focal abnormalities noted  EKG:  Atrial fibrillation with controlled rate at 80 beats per minute an occasional PVC.    Signed, Darci Needle III, MD 03/01/2013 12:22 PM

## 2013-03-01 NOTE — Patient Instructions (Signed)
Flu shot today  Your physician recommends that you continue on your current medications as directed. Please refer to the Current Medication list given to you today.  Your physician wants you to follow-up in: 3 months You will receive a reminder letter in the mail two months in advance. If you don't receive a letter, please call our office to schedule the follow-up appointment.

## 2013-03-12 ENCOUNTER — Other Ambulatory Visit (HOSPITAL_COMMUNITY): Payer: Self-pay | Admitting: Internal Medicine

## 2013-03-15 ENCOUNTER — Ambulatory Visit (INDEPENDENT_AMBULATORY_CARE_PROVIDER_SITE_OTHER): Payer: Medicare Other | Admitting: Pharmacist

## 2013-03-15 DIAGNOSIS — I4891 Unspecified atrial fibrillation: Secondary | ICD-10-CM

## 2013-03-31 ENCOUNTER — Ambulatory Visit (INDEPENDENT_AMBULATORY_CARE_PROVIDER_SITE_OTHER): Payer: Medicare Other | Admitting: Pharmacist

## 2013-03-31 DIAGNOSIS — I4891 Unspecified atrial fibrillation: Secondary | ICD-10-CM

## 2013-03-31 LAB — POCT INR: INR: 2

## 2013-04-19 ENCOUNTER — Emergency Department (HOSPITAL_BASED_OUTPATIENT_CLINIC_OR_DEPARTMENT_OTHER): Payer: Medicare Other

## 2013-04-19 ENCOUNTER — Encounter (HOSPITAL_BASED_OUTPATIENT_CLINIC_OR_DEPARTMENT_OTHER): Payer: Self-pay | Admitting: Emergency Medicine

## 2013-04-19 ENCOUNTER — Emergency Department (HOSPITAL_BASED_OUTPATIENT_CLINIC_OR_DEPARTMENT_OTHER)
Admission: EM | Admit: 2013-04-19 | Discharge: 2013-04-19 | Disposition: A | Discharge status: Home / Self Care | Payer: Medicare Other | Attending: Emergency Medicine | Admitting: Emergency Medicine

## 2013-04-19 DIAGNOSIS — Z7901 Long term (current) use of anticoagulants: Secondary | ICD-10-CM | POA: Insufficient documentation

## 2013-04-19 DIAGNOSIS — W010XXA Fall on same level from slipping, tripping and stumbling without subsequent striking against object, initial encounter: Secondary | ICD-10-CM | POA: Insufficient documentation

## 2013-04-19 DIAGNOSIS — Y939 Activity, unspecified: Secondary | ICD-10-CM | POA: Insufficient documentation

## 2013-04-19 DIAGNOSIS — S42213A Unspecified displaced fracture of surgical neck of unspecified humerus, initial encounter for closed fracture: Secondary | ICD-10-CM | POA: Insufficient documentation

## 2013-04-19 DIAGNOSIS — I509 Heart failure, unspecified: Secondary | ICD-10-CM | POA: Insufficient documentation

## 2013-04-19 DIAGNOSIS — I10 Essential (primary) hypertension: Secondary | ICD-10-CM | POA: Insufficient documentation

## 2013-04-19 DIAGNOSIS — S42292A Other displaced fracture of upper end of left humerus, initial encounter for closed fracture: Secondary | ICD-10-CM

## 2013-04-19 DIAGNOSIS — S8990XA Unspecified injury of unspecified lower leg, initial encounter: Secondary | ICD-10-CM | POA: Insufficient documentation

## 2013-04-19 DIAGNOSIS — H409 Unspecified glaucoma: Secondary | ICD-10-CM | POA: Insufficient documentation

## 2013-04-19 DIAGNOSIS — R269 Unspecified abnormalities of gait and mobility: Secondary | ICD-10-CM | POA: Insufficient documentation

## 2013-04-19 DIAGNOSIS — Z79899 Other long term (current) drug therapy: Secondary | ICD-10-CM | POA: Insufficient documentation

## 2013-04-19 DIAGNOSIS — Z954 Presence of other heart-valve replacement: Secondary | ICD-10-CM | POA: Insufficient documentation

## 2013-04-19 DIAGNOSIS — H269 Unspecified cataract: Secondary | ICD-10-CM | POA: Insufficient documentation

## 2013-04-19 DIAGNOSIS — Y929 Unspecified place or not applicable: Secondary | ICD-10-CM | POA: Insufficient documentation

## 2013-04-19 DIAGNOSIS — Z853 Personal history of malignant neoplasm of breast: Secondary | ICD-10-CM | POA: Insufficient documentation

## 2013-04-19 DIAGNOSIS — S6990XA Unspecified injury of unspecified wrist, hand and finger(s), initial encounter: Secondary | ICD-10-CM | POA: Insufficient documentation

## 2013-04-19 DIAGNOSIS — S42293A Other displaced fracture of upper end of unspecified humerus, initial encounter for closed fracture: Secondary | ICD-10-CM

## 2013-04-19 DIAGNOSIS — K219 Gastro-esophageal reflux disease without esophagitis: Secondary | ICD-10-CM | POA: Insufficient documentation

## 2013-04-19 DIAGNOSIS — M109 Gout, unspecified: Secondary | ICD-10-CM | POA: Insufficient documentation

## 2013-04-19 DIAGNOSIS — R011 Cardiac murmur, unspecified: Secondary | ICD-10-CM | POA: Insufficient documentation

## 2013-04-19 DIAGNOSIS — W108XXA Fall (on) (from) other stairs and steps, initial encounter: Secondary | ICD-10-CM | POA: Insufficient documentation

## 2013-04-19 LAB — PROTIME-INR
INR: 1.78 — ABNORMAL HIGH (ref 0.00–1.49)
Prothrombin Time: 20.2 seconds — ABNORMAL HIGH (ref 11.6–15.2)

## 2013-04-19 MED ORDER — OXYCODONE-ACETAMINOPHEN 5-325 MG PO TABS
1.0000 | ORAL_TABLET | Freq: Once | ORAL | Status: AC
  Administered 2013-04-19: 1 via ORAL
  Filled 2013-04-19: qty 1

## 2013-04-19 MED ORDER — OXYCODONE-ACETAMINOPHEN 5-325 MG PO TABS: 1.0000 | ORAL_TABLET | Freq: Four times a day (QID) | ORAL | Status: DC | PRN

## 2013-04-19 NOTE — ED Notes (Signed)
Pt's family Ecologist)  phone number is  [phone]

## 2013-04-19 NOTE — ED Provider Notes (Signed)
CSN: [id number]     Arrival date & time 04/19/13  1747 History  This chart was scribed for Junius Argyle, MD by Carl Best, ED Scribe. This patient was seen in room MH05/MH05 and the patient's care was started at 8:48 PM.     Chief Complaint  Patient presents with  . Fall    Patient is a 79 y.o. female presenting with fall. The history is provided by the patient. No language interpreter was used.  Fall This is a new problem. The current episode started 1 to 2 hours ago. Episode frequency: once. The problem has been resolved. Pertinent negatives include no headaches. Nothing aggravates the symptoms. Nothing relieves the symptoms. She has tried nothing for the symptoms. The treatment provided no relief.   HPI Comments: [name] is a 79 y.o. female who presents to the Emergency Department complaining of left upper arm pain that started this evening after the patient's cane slipped on wet steps on her porch and she fell.  She states that she fell on her knees and is unsure as to whether she hit her arm on anything at the time of the fall.  The patient states that her left hand hurts as well.  She denies hitting her head at the time of the fall.  She denies knee pain, neck pain, and back pain as associated symptoms.  She denies having any allergies.     Past Medical History  Diagnosis Date  . Hypertension   . GERD (gastroesophageal reflux disease)   . Glaucoma   . Cataract   . Breast cancer     right  . Valvular heart disease   . Hyperlipidemia   . Gout   . Shortness of breath   . Heart murmur   . CHF (congestive heart failure)    Past Surgical History  Procedure Laterality Date  . Cardiac valve replacement  2001    per medical history form dated 10/21/10  . Appendectomy  1980's    Per patient son. not sure of date.  . Tubal ligation    . Breast surgery  2012    Right br. Lumpectomy   Family History  Problem Relation Age of Onset  . Hypertension Mother   .  Other Mother     Alzheimers  . Cancer Father     leukemia  . Hypertension Brother   . Other Brother     breathing problems   History  Substance Use Topics  . Smoking status: Never Smoker   . Smokeless tobacco: Never Used  . Alcohol Use: No   OB History   Grav Para Term Preterm Abortions TAB SAB Ect Mult Living                 Review of Systems  Constitutional: Negative for fever.  HENT: Negative for congestion, rhinorrhea and sore throat.   Eyes: Negative for redness.  Respiratory: Negative for cough.   Gastrointestinal: Negative for nausea, vomiting and diarrhea.  Endocrine: Negative for polydipsia and polyuria.  Genitourinary: Negative for urgency, decreased urine volume and difficulty urinating.  Musculoskeletal: Positive for arthralgias (left upper arm) and gait problem. Negative for back pain and neck pain.  Neurological: Negative for dizziness, weakness and headaches.  Psychiatric/Behavioral: Negative for agitation.  All other systems reviewed and are negative.    Allergies  Review of patient's allergies indicates no known allergies.  Home Medications   Current Outpatient Rx  Name  Route  Sig  Dispense  Refill  . acetaminophen (TYLENOL) 325 MG tablet   Oral   Take 325 mg by mouth every 6 (six) hours as needed for pain.         Marland Kitchen allopurinol (ZYLOPRIM) 100 MG tablet   Oral   Take 100 mg by mouth daily.          . Calcium Carbonate-Vitamin D ([employer]S CALCIUM 600 + D PO)   Oral   Take by mouth 2 (two) times daily.           Marland Kitchen diltiazem (CARDIZEM CD) 240 MG 24 hr capsule      TAKE ONE CAPSULE BY MOUTH EVERY DAY   30 capsule   0   . diltiazem (TIAZAC) 360 MG 24 hr capsule   Oral   Take 360 mg by mouth daily.          . febuxostat (ULORIC) 40 MG tablet   Oral   Take 40 mg by mouth daily.         . pantoprazole (PROTONIX) 40 MG tablet      TAKE 1 TABLET BY MOUTH EVERY DAY AT 12 NOON   30 tablet   0   . potassium chloride SA  (K-DUR,KLOR-CON) 20 MEQ tablet      TAKE 1 TABLET TWICE DAILY   60 tablet   7   . torsemide (DEMADEX) 20 MG tablet      TAKE 2 TABLETS BY MOUTH EVERY DAY   60 tablet   6   . TRAVATAN Z 0.004 % SOLN ophthalmic solution   Both Eyes   Place 1 drop into both eyes at bedtime.          Marland Kitchen warfarin (COUMADIN) 2.5 MG tablet   Oral   Take 5-7.5 mg by mouth daily. 2 tablets daily except for 3 tablets on Monday, Wednesday, Friday.          Triage Vitals: BP 90/52  Pulse 63  Temp(Src) 98.2 F (36.8 C) (Oral)  Resp 18  Wt 140 lb (63.504 kg)  SpO2 97%  Physical Exam  Nursing note and vitals reviewed. Constitutional: She is oriented to person, place, and time. She appears well-developed and well-nourished.  HENT:  Head: Normocephalic and atraumatic.  Mouth/Throat: Oropharynx is clear and moist.  Eyes: Conjunctivae and EOM are normal. Pupils are equal, round, and reactive to light.  Neck: Normal range of motion and phonation normal. Neck supple.  No focal vertebral ttp.   Cardiovascular: Normal rate, regular rhythm and intact distal pulses.   Pulmonary/Chest: Effort normal and breath sounds normal. She exhibits no tenderness.  Abdominal: Soft. She exhibits no distension. There is no tenderness. There is no guarding.  Musculoskeletal: Normal range of motion.  Mild deformity to the left proximal humerus.  2+ distal pulses in the LUE.  Sensation intact in the left upper extremity.  Normal movement of the digits of of the left hand as well as the left wrist.   No other focal ttp of the LUE.   Mild abrasion to right knee. No focal ttp of knees or hips bilaterally.     Neurological: She is alert and oriented to person, place, and time. She exhibits normal muscle tone.  Skin: Skin is warm and dry.  Psychiatric: She has a normal mood and affect. Her behavior is normal. Judgment and thought content normal.    ED Course  Procedures (including critical care time)  DIAGNOSTIC  STUDIES: Oxygen Saturation is 97% on room air, adequate by  my interpretation.    COORDINATION OF CARE: 8:51 PM- Discussed the x-ray results with the patient which revealed a fractured bone in her left arm.  Discussed administering a Percocet in the ED with the patient.  Discussed obtaining a CT scan of the patient's head and an x-ray of the patient's left elbow.  The patient agreed to the treatment plan.     Labs Review Labs Reviewed  PROTIME-INR - Abnormal; Notable for the following:    Prothrombin Time 20.2 (*)    INR 1.78 (*)    All other components within normal limits   Imaging Review Dg Elbow Complete Left  04/19/2013   CLINICAL DATA:  Status post fall; left elbow pain.  EXAM: LEFT ELBOW - COMPLETE 3+ VIEW  COMPARISON:  None.  FINDINGS: An apparent small elbow joint effusion is noted, without definite evidence of underlying fracture, though an occult radial head fracture cannot be excluded. No significant soft tissue abnormalities are otherwise characterized.  IMPRESSION: Apparent small elbow joint effusion, without definite evidence of underlying fracture. An occult radial head fracture cannot be excluded.   Electronically Signed   By: Roanna Raider M.D.   On: 04/19/2013 21:43   Ct Head Wo Contrast  04/19/2013   CLINICAL DATA:  Status post fall; patient on Coumadin. Concern for head injury.  EXAM: CT HEAD WITHOUT CONTRAST  TECHNIQUE: Contiguous axial images were obtained from the base of the skull through the vertex without intravenous contrast.  COMPARISON:  None.  FINDINGS: There is no evidence of acute infarction, mass lesion, or intra- or extra-axial hemorrhage on CT.  Prominence of the sulci suggests mild cortical volume loss. Small chronic lacunar infarcts are noted within the basal ganglia bilaterally. Mild periventricular and subcortical white matter change likely reflects small vessel ischemic microangiopathy.  The brainstem and fourth ventricle are within normal limits. The  cerebral hemispheres demonstrate grossly normal gray-white differentiation. No mass effect or midline shift is seen.  There is no evidence of fracture; visualized osseous structures are unremarkable in appearance. The orbits are within normal limits. The paranasal sinuses and mastoid air cells are well-aerated. No significant soft tissue abnormalities are seen.  IMPRESSION: 1. No evidence of traumatic intracranial injury or fracture. 2. Mild cortical volume loss and scattered small vessel ischemic microangiopathy; small chronic lacunar infarcts noted in the basal ganglia bilaterally.   Electronically Signed   By: Roanna Raider M.D.   On: 04/19/2013 21:26   Ct Shoulder Left Wo Contrast  04/19/2013   CLINICAL DATA:  Proximal humerus fracture.Fall.  Left shoulder pain.  EXAM: CT OF THE LEFT SHOULDER WITHOUT CONTRAST  TECHNIQUE: Multidetector CT imaging was performed according to the standard protocol. Multiplanar CT image reconstructions were also generated.  COMPARISON:  04/19/2013 radiographs.  FINDINGS: Comminuted proximal left humerus fracture is present. 17 mm medial displacement of the proximal metaphysis relative to the head. The anatomic neck appears intact. Surgical neck shows a transverse fracture. 17 mm anterior displacement of the proximal humeral metadiaphysis in relation to the head. Fracture extends into the bicipital groove without entrapment. Lesser tuberosity fracture fragments are mildly displaced. Fracture extends to the inferior aspect of the greater tuberosity without displacement. Scapula is intact. Pre-existing moderate glenohumeral osteoarthritis is present with subchondral cysts in the glenoid. Sclerosis of the superior humeral head suggests chronic rotator cuff tear. Type 2 acromion with moderate AC joint osteoarthritis. Distal clavicle undersurface spurring. Mild subscapularis muscular atrophy. Supraspinatus and infraspinatus grossly appear intact. Incidental visualization of the chest  demonstrates aortic atherosclerosis. No displaced rib fractures. Clavicle appears intact.  IMPRESSION: Comminuted proximal left humerus fracture with displacement at the surgical neck compatible with a 1 part fracture.   Electronically Signed   By: Andreas Newport M.D.   On: 04/19/2013 22:35   Dg Shoulder Left  04/19/2013   CLINICAL DATA:  Left shoulder pain after fall.  EXAM: LEFT SHOULDER - 2+ VIEW  COMPARISON:  October 25, 2010.  FINDINGS: Visualized ribs appear normal. Severely displaced and probably comminuted fracture is seen involving the proximal left humeral neck. The humeral head remains situated within the glenoid fossa.  IMPRESSION: Severely displaced and probably comminuted fracture involving the proximal left humeral neck.   Electronically Signed   By: Roque Lias M.D.   On: 04/19/2013 19:57    EKG Interpretation   None       MDM   1. Humeral head fracture, left, closed, initial encounter    9:00 PM 79 y.o. female who presents with mechanical fall which occurred prior to arrival. She states that she slipped while walking on her back porch. She states that she landed on her knees and is not sure what her left shoulder hit. She denies hitting her head or loss of consciousness. She is found to have a left proximal humeral neck fx. she is afebrile and vital signs are unremarkable here. Will get screening CT of head, check INR, and pain control with Percocet which pt prefers.   11:07 PM: Discussed case w/ Dr. Victorino Dike (ortho) who will see pt next week. Pt ambulates at home w/ a walker which will be limited d/t the injury. She lives w/ her son who is here and states that he can take care of her. She was able to ambulate here. I spoke w/ the care manager on call and ordered a face to face so that she can be evaluated at home. Plain film of elbow cannot r/o occult radial head fx d/t small elbow effusion. No focal ttp on my exam, low suspicion for fx in this location.  I have discussed the  diagnosis/risks/treatment options with the patient and family and believe the pt to be eligible for discharge home to follow-up with Dr. Victorino Dike next week, call tomorrow to confirm appt. We also discussed returning to the ED immediately if new or worsening sx occur. We discussed the sx which are most concerning (e.g., worsening pain, fever) that necessitate immediate return. Any new prescriptions provided to the patient are listed below.  New Prescriptions   OXYCODONE-ACETAMINOPHEN (PERCOCET) 5-325 MG PER TABLET    Take 1 tablet by mouth every 6 (six) hours as needed for moderate pain.      I personally performed the services described in this documentation, which was scribed in my presence. The recorded information has been reviewed and is accurate.    Junius Argyle, MD 04/19/13 [phone]

## 2013-04-19 NOTE — ED Notes (Signed)
Two blood draw attempts unsuccessful.

## 2013-04-19 NOTE — ED Notes (Signed)
Tripped/fell on wet steps approx 1 hour PTA-pain to left upper arm-pt into triage via w/c

## 2013-04-20 NOTE — Progress Notes (Signed)
   CARE MANAGEMENT NOTE 04/20/2013  Patient:  [name], [name]   Account Number:  [id number]  Date Initiated:  04/20/2013  Documentation initiated by:  [facility]  Subjective/Objective Assessment:   79 y.o. female presenting with fall. //Home with son, [name]     Action/Plan:   CT scan and x-ray r/o fractures, broken bones//Home with [facility]   Anticipated DC Date:  04/19/2013   Anticipated DC Plan:  HOME W HOME HEALTH SERVICES      DC Planning Services  CM consult      [facility] Choice  HOME HEALTH   Choice offered to / List presented to:  C-1 Patient        HH arranged  HH-2 PT  HH-4 NURSE'S AIDE      HH agency  [facility]   Status of service:  Completed, signed off Medicare Important Message given?   (If response is "NO", the following Medicare IM given date fields will be blank) Date Medicare IM given:   Date Additional Medicare IM given:    Discharge Disposition:    Per UR Regulation:    If discussed at Long Length of Stay Meetings, dates discussed:    Comments:  04/20/13 1150 Oletta Cohn, RN, BSN, NSM [phone] Spoke with pt and son, [name] via telephone regarding discharge planning for [employer]. Read pt list of home health agencies to choose from.  Pt chose [facility] to render services of PT/NA. Darlin Drop of [facility] notified.  No DME needs identified at this time.

## 2013-04-24 ENCOUNTER — Other Ambulatory Visit: Payer: Self-pay | Admitting: Oncology

## 2013-04-25 ENCOUNTER — Ambulatory Visit (INDEPENDENT_AMBULATORY_CARE_PROVIDER_SITE_OTHER): Payer: Medicare Other | Admitting: Pharmacist

## 2013-04-25 DIAGNOSIS — I4891 Unspecified atrial fibrillation: Secondary | ICD-10-CM

## 2013-04-25 LAB — POCT INR: INR: 3

## 2013-04-26 NOTE — Care Management Note (Signed)
    Page 1 of 1   04/20/2013     12:07:17 PM   CARE MANAGEMENT NOTE 04/20/2013  Patient:  [name], [name]   Account Number:  [id number]  Date Initiated:  04/20/2013  Documentation initiated by:  Oletta Cohn  Subjective/Objective Assessment:   79 y.o. female presenting with fall. //Home with son, Link Snuffer     Action/Plan:   CT scan and x-ray r/o fractures, broken bones//Home with [facility]   Anticipated DC Date:  04/19/2013   Anticipated DC Plan:  HOME W HOME HEALTH SERVICES      DC Planning Services  CM consult      [facility] Choice  HOME HEALTH   Choice offered to / List presented to:  C-1 Patient        HH arranged  HH-2 PT  HH-4 NURSE'S AIDE      HH agency  [facility]   Status of service:  Completed, signed off Medicare Important Message given?   (If response is "NO", the following Medicare IM given date fields will be blank) Date Medicare IM given:   Date Additional Medicare IM given:    Discharge Disposition:    Per UR Regulation:    If discussed at Long Length of Stay Meetings, dates discussed:    Comments:  04/20/13 1150 Oletta Cohn, RN, BSN, NSM [phone] Spoke with pt and son, Link Snuffer via telephone regarding discharge planning for [employer]. Read pt list of home health agencies to choose from.  Pt chose [facility] to render services of PT/NA. Darlin Drop of [facility] notified.  No DME needs identified at this time.

## 2013-05-06 ENCOUNTER — Ambulatory Visit (INDEPENDENT_AMBULATORY_CARE_PROVIDER_SITE_OTHER): Payer: 59 | Admitting: Pharmacist

## 2013-05-06 DIAGNOSIS — I4891 Unspecified atrial fibrillation: Secondary | ICD-10-CM

## 2013-05-06 LAB — POCT INR: INR: 3.7

## 2013-05-16 ENCOUNTER — Ambulatory Visit (INDEPENDENT_AMBULATORY_CARE_PROVIDER_SITE_OTHER): Payer: 59 | Admitting: Pharmacist

## 2013-05-16 DIAGNOSIS — I4891 Unspecified atrial fibrillation: Secondary | ICD-10-CM

## 2013-05-16 LAB — POCT INR: INR: 2.1

## 2013-06-01 ENCOUNTER — Other Ambulatory Visit: Payer: Self-pay | Admitting: Pharmacist

## 2013-06-01 MED ORDER — WARFARIN SODIUM 2.5 MG PO TABS: ORAL_TABLET | ORAL | Status: DC

## 2013-06-03 ENCOUNTER — Ambulatory Visit (INDEPENDENT_AMBULATORY_CARE_PROVIDER_SITE_OTHER): Payer: 59 | Admitting: Pharmacist

## 2013-06-03 DIAGNOSIS — Z5181 Encounter for therapeutic drug level monitoring: Secondary | ICD-10-CM | POA: Insufficient documentation

## 2013-06-03 DIAGNOSIS — I4891 Unspecified atrial fibrillation: Secondary | ICD-10-CM

## 2013-06-03 LAB — POCT INR: INR: 1.5

## 2013-06-09 ENCOUNTER — Ambulatory Visit (INDEPENDENT_AMBULATORY_CARE_PROVIDER_SITE_OTHER): Payer: 59 | Admitting: Pharmacist

## 2013-06-09 ENCOUNTER — Encounter: Payer: Self-pay | Admitting: Interventional Cardiology

## 2013-06-09 ENCOUNTER — Ambulatory Visit (INDEPENDENT_AMBULATORY_CARE_PROVIDER_SITE_OTHER): Payer: 59 | Admitting: Interventional Cardiology

## 2013-06-09 ENCOUNTER — Encounter (INDEPENDENT_AMBULATORY_CARE_PROVIDER_SITE_OTHER): Payer: Self-pay

## 2013-06-09 VITALS — BP 150/60 | HR 60 | Ht 60.0 in | Wt 144.0 lb

## 2013-06-09 DIAGNOSIS — I5032 Chronic diastolic (congestive) heart failure: Secondary | ICD-10-CM

## 2013-06-09 DIAGNOSIS — I4891 Unspecified atrial fibrillation: Secondary | ICD-10-CM

## 2013-06-09 DIAGNOSIS — I10 Essential (primary) hypertension: Secondary | ICD-10-CM

## 2013-06-09 DIAGNOSIS — Z5181 Encounter for therapeutic drug level monitoring: Secondary | ICD-10-CM

## 2013-06-09 DIAGNOSIS — Z9889 Other specified postprocedural states: Secondary | ICD-10-CM

## 2013-06-09 LAB — POCT INR: INR: 1.2

## 2013-06-09 MED ORDER — DILTIAZEM HCL ER BEADS 360 MG PO CP24: 360.0000 mg | ORAL_CAPSULE | Freq: Every day | ORAL | Status: DC

## 2013-06-09 MED ORDER — PANTOPRAZOLE SODIUM 40 MG PO TBEC: DELAYED_RELEASE_TABLET | ORAL | Status: DC

## 2013-06-09 MED ORDER — TORSEMIDE 20 MG PO TABS: ORAL_TABLET | ORAL | Status: DC

## 2013-06-09 NOTE — Patient Instructions (Signed)
Your physician recommends that you schedule a follow-up appointment in: 2 to 3 months with DR. Tamala Julian  Your physician recommends that you continue on your current medications as directed. Please refer to the Current Medication list given to you today.

## 2013-06-09 NOTE — Progress Notes (Signed)
Patient ID: [name], female   DOB: 07/28/1933, 80 y.o.   MRN: [id number]    [address] [place], [place]  [address] Phone: [phone] Fax:  [phone]  Date:  06/09/2013   ID:  SYDNEE LAMOUR, DOB 1934/03/19, MRN [id number]  PCP:  Lauraine Rinne, MD   ASSESSMENT:  1. Chronic atrial fibrillation with good rate control 2. Chronic diastolic heart failure, stable without significant weight gain 3. Pulmonary hypertension 4. Status post mitral valve repair, 2001 5. Aortic regurgitation and pulmonic regurgitation  PLAN:  1. heart failure is under control. No change in the current medical regimen 2. Encourage adherence to fluid and salt restriction 3. Encouraged to weigh daily and to call if greater than 2 pound weight gain from day to day 4. Clinical followup in 2 months. Call earlier if dyspnea or weight gain   SUBJECTIVE: [name] is a 80 y.o. female who fell Christmas Eve fracturing her left arm and injuring her back. She spent the last 6-8 weeks recuperating. She has been relatively nonambulatory. She now has occupational and physical rehabilitation services at home to increase her mobility and balance. She denies shortness of breath. No significant swelling. She is falling short of daily weights because of difficulty with mobility. She is looking forward to getting back to her typical routine.   Wt Readings from Last 3 Encounters:  06/09/13 144 lb (65.318 kg)  04/19/13 140 lb (63.504 kg)  03/01/13 151 lb 12.8 oz (68.856 kg)     Past Medical History  Diagnosis Date  . Hypertension   . GERD (gastroesophageal reflux disease)   . Glaucoma   . Cataract   . Breast cancer     right  . Valvular heart disease   . Hyperlipidemia   . Gout   . Shortness of breath   . Heart murmur   . CHF (congestive heart failure)     Current Outpatient Prescriptions  Medication Sig Dispense Refill  . acetaminophen (TYLENOL) 325 MG tablet Take 325 mg  by mouth every 6 (six) hours as needed for pain.      Marland Kitchen allopurinol (ZYLOPRIM) 100 MG tablet Take 100 mg by mouth daily.       . Calcium Carbonate-Vitamin D (CVS CALCIUM 600 + D PO) Take by mouth 2 (two) times daily.        Marland Kitchen diltiazem (TIAZAC) 360 MG 24 hr capsule Take 360 mg by mouth daily.       Marland Kitchen DILTIAZEM HCL PO Take 1 tablet by mouth daily.      . febuxostat (ULORIC) 40 MG tablet Take 40 mg by mouth daily.      Marland Kitchen oxyCODONE-acetaminophen (PERCOCET) 5-325 MG per tablet Take 1 tablet by mouth every 6 (six) hours as needed for moderate pain.  30 tablet  0  . pantoprazole (PROTONIX) 40 MG tablet TAKE 1 TABLET BY MOUTH EVERY DAY AT 12 NOON  30 tablet  0  . potassium chloride SA (K-DUR,KLOR-CON) 20 MEQ tablet TAKE 1 TABLET TWICE DAILY  60 tablet  7  . torsemide (DEMADEX) 20 MG tablet TAKE 2 TABLETS BY MOUTH EVERY DAY  60 tablet  6  . TRAVATAN Z 0.004 % SOLN ophthalmic solution Place 1 drop into both eyes at bedtime.       Marland Kitchen warfarin (COUMADIN) 2.5 MG tablet Take as directed by Anticoagulation clinic  90 tablet  3   No current facility-administered medications for this visit.  Allergies:   No Known Allergies  Social History:  The patient  reports that she has never smoked. She has never used smokeless tobacco. She reports that she does not drink alcohol or use illicit drugs.   ROS:  Please see the history of present illness.   No bleeding or hemorrhagic complication of the fall. Denies dyspnea.   All other systems reviewed and negative.   OBJECTIVE: VS:  BP 150/60  Pulse 60  Ht 5' (1.524 m)  Wt 144 lb (65.318 kg)  BMI 28.12 kg/m2 Well nourished, well developed, in no acute distress, elderly and frail HEENT: normal Neck: JVD moderate C. V waves even sitting at 90. Carotid bruit absent  Cardiac:  normal S1, S2; RRR; 2-3/6 decrescendo murmur Lungs:  clear to auscultation bilaterally, no wheezing, rhonchi or rales Abd: soft, nontender, no hepatomegaly Ext: Edema  Chronic stasis changes  bilateral lower extremities. 1+ edema . Pulses Difficult to palpate  Skin: warm and dry Neuro:  CNs 2-12 intact, no focal abnormalities noted  EKG:  Not performed     Past Medical History  Mitral valve repair with Lovena Le #29 posterior ring annuloplasty, 2001, Dr. Servando Snare   Chronic atrial fibrillation   Hypertension   Chronic diastolic heart failure, LVEF normal, with moderate pulmonary hypertension, 2009 echo. most recent echocardiogram 7/13--EF 65-70%    Signed, Illene Labrador III, MD 06/09/2013 11:02 AM

## 2013-06-17 ENCOUNTER — Ambulatory Visit (INDEPENDENT_AMBULATORY_CARE_PROVIDER_SITE_OTHER): Payer: 59 | Admitting: Pharmacist

## 2013-06-17 DIAGNOSIS — Z5181 Encounter for therapeutic drug level monitoring: Secondary | ICD-10-CM

## 2013-06-17 DIAGNOSIS — I4891 Unspecified atrial fibrillation: Secondary | ICD-10-CM

## 2013-06-17 LAB — POCT INR: INR: 2.6

## 2013-06-21 ENCOUNTER — Other Ambulatory Visit: Payer: Self-pay | Admitting: *Deleted

## 2013-06-22 ENCOUNTER — Other Ambulatory Visit: Payer: Self-pay | Admitting: *Deleted

## 2013-06-22 MED ORDER — POTASSIUM CHLORIDE CRYS ER 20 MEQ PO TBCR: 20.0000 meq | EXTENDED_RELEASE_TABLET | Freq: Two times a day (BID) | ORAL | Status: DC

## 2013-07-01 ENCOUNTER — Ambulatory Visit (INDEPENDENT_AMBULATORY_CARE_PROVIDER_SITE_OTHER): Payer: 59 | Admitting: Pharmacist

## 2013-07-01 DIAGNOSIS — I4891 Unspecified atrial fibrillation: Secondary | ICD-10-CM

## 2013-07-01 DIAGNOSIS — Z5181 Encounter for therapeutic drug level monitoring: Secondary | ICD-10-CM

## 2013-07-01 LAB — POCT INR: INR: 2.8

## 2013-07-22 ENCOUNTER — Ambulatory Visit (INDEPENDENT_AMBULATORY_CARE_PROVIDER_SITE_OTHER): Payer: 59 | Admitting: Pharmacist

## 2013-07-22 DIAGNOSIS — Z5181 Encounter for therapeutic drug level monitoring: Secondary | ICD-10-CM

## 2013-07-22 DIAGNOSIS — I4891 Unspecified atrial fibrillation: Secondary | ICD-10-CM

## 2013-07-22 LAB — POCT INR: INR: 2.3

## 2013-08-01 ENCOUNTER — Telehealth: Payer: Self-pay | Admitting: Oncology

## 2013-08-01 NOTE — Telephone Encounter (Signed)
Faxed pt medical records to [facility] °

## 2013-08-19 ENCOUNTER — Ambulatory Visit (INDEPENDENT_AMBULATORY_CARE_PROVIDER_SITE_OTHER): Payer: 59

## 2013-08-19 DIAGNOSIS — I4891 Unspecified atrial fibrillation: Secondary | ICD-10-CM

## 2013-08-19 DIAGNOSIS — Z5181 Encounter for therapeutic drug level monitoring: Secondary | ICD-10-CM

## 2013-08-19 LAB — POCT INR: INR: 1.9

## 2013-09-16 ENCOUNTER — Ambulatory Visit (INDEPENDENT_AMBULATORY_CARE_PROVIDER_SITE_OTHER): Payer: 59 | Admitting: *Deleted

## 2013-09-16 DIAGNOSIS — I4891 Unspecified atrial fibrillation: Secondary | ICD-10-CM

## 2013-09-16 DIAGNOSIS — Z5181 Encounter for therapeutic drug level monitoring: Secondary | ICD-10-CM

## 2013-09-16 LAB — POCT INR: INR: 2

## 2013-10-04 ENCOUNTER — Encounter: Payer: Self-pay | Admitting: Interventional Cardiology

## 2013-10-13 ENCOUNTER — Other Ambulatory Visit: Payer: Self-pay | Admitting: *Deleted

## 2013-10-13 MED ORDER — WARFARIN SODIUM 2.5 MG PO TABS: ORAL_TABLET | ORAL | Status: DC

## 2013-10-14 ENCOUNTER — Ambulatory Visit (INDEPENDENT_AMBULATORY_CARE_PROVIDER_SITE_OTHER): Payer: 59 | Admitting: Pharmacist

## 2013-10-14 DIAGNOSIS — Z5181 Encounter for therapeutic drug level monitoring: Secondary | ICD-10-CM

## 2013-10-14 DIAGNOSIS — I4891 Unspecified atrial fibrillation: Secondary | ICD-10-CM

## 2013-10-14 LAB — POCT INR: INR: 1.6

## 2013-10-26 ENCOUNTER — Ambulatory Visit: Payer: 59 | Admitting: Interventional Cardiology

## 2013-10-27 ENCOUNTER — Ambulatory Visit (INDEPENDENT_AMBULATORY_CARE_PROVIDER_SITE_OTHER): Payer: 59 | Admitting: *Deleted

## 2013-10-27 DIAGNOSIS — Z5181 Encounter for therapeutic drug level monitoring: Secondary | ICD-10-CM

## 2013-10-27 DIAGNOSIS — I4891 Unspecified atrial fibrillation: Secondary | ICD-10-CM

## 2013-10-27 LAB — POCT INR: INR: 1.8

## 2013-11-10 ENCOUNTER — Ambulatory Visit (INDEPENDENT_AMBULATORY_CARE_PROVIDER_SITE_OTHER): Payer: 59 | Admitting: *Deleted

## 2013-11-10 DIAGNOSIS — Z5181 Encounter for therapeutic drug level monitoring: Secondary | ICD-10-CM

## 2013-11-10 DIAGNOSIS — I4891 Unspecified atrial fibrillation: Secondary | ICD-10-CM

## 2013-11-10 LAB — POCT INR: INR: 3.1

## 2013-11-11 NOTE — Progress Notes (Signed)
Quick Note:  Preliminary report reviewed by triage nurse and sent to MD desk. ______ 

## 2013-11-21 ENCOUNTER — Other Ambulatory Visit: Payer: Self-pay | Admitting: Interventional Cardiology

## 2013-11-29 ENCOUNTER — Ambulatory Visit (INDEPENDENT_AMBULATORY_CARE_PROVIDER_SITE_OTHER): Payer: 59

## 2013-11-29 ENCOUNTER — Ambulatory Visit (INDEPENDENT_AMBULATORY_CARE_PROVIDER_SITE_OTHER): Payer: 59 | Admitting: Interventional Cardiology

## 2013-11-29 ENCOUNTER — Encounter: Payer: Self-pay | Admitting: Interventional Cardiology

## 2013-11-29 VITALS — BP 146/72 | HR 76 | Ht 60.0 in | Wt 145.0 lb

## 2013-11-29 DIAGNOSIS — Z5181 Encounter for therapeutic drug level monitoring: Secondary | ICD-10-CM

## 2013-11-29 DIAGNOSIS — I10 Essential (primary) hypertension: Secondary | ICD-10-CM

## 2013-11-29 DIAGNOSIS — Z7901 Long term (current) use of anticoagulants: Secondary | ICD-10-CM

## 2013-11-29 DIAGNOSIS — I4891 Unspecified atrial fibrillation: Secondary | ICD-10-CM

## 2013-11-29 DIAGNOSIS — I5032 Chronic diastolic (congestive) heart failure: Secondary | ICD-10-CM

## 2013-11-29 DIAGNOSIS — Z9889 Other specified postprocedural states: Secondary | ICD-10-CM

## 2013-11-29 LAB — POCT INR: INR: 1.8

## 2013-11-29 NOTE — Progress Notes (Signed)
Patient ID: [name], female   DOB: 09-07-1933, 80 y.o.   MRN: [id number]    [address], [place]  [address] Phone: [phone] Fax:  [phone]  Date:  11/29/2013   ID:  SHERREY NORTH, DOB 1934-03-30, MRN [id number]  PCP:  Lauraine Rinne, MD   ASSESSMENT:  1. Chronic diastolic heart failure, stable 2. Chronic atrial fibrillation with good rate control 3. Chronic anticoagulation without complications 4. Mitral regurgitation, status post mitral valve repair  PLAN:  1. Continue current medical regimen 2. Fluid and salt restrictions along with daily weights As we have been doing. Weight is above 147 pounds her concerning and she should call. 3. Clinical followup in 6 months   SUBJECTIVE: [name] is a 80 y.o. female who is doing well. No hospitalizations over the past 6-12 months. Weight has been very stable. She calls if problems. She is a hearing current diet and salt restrictions. No significant lower extremity swelling.   Wt Readings from Last 3 Encounters:  11/29/13 145 lb (65.772 kg)  06/09/13 144 lb (65.318 kg)  04/19/13 140 lb (63.504 kg)     Past Medical History  Diagnosis Date  . Hypertension   . GERD (gastroesophageal reflux disease)   . Glaucoma   . Cataract   . Breast cancer     right  . Valvular heart disease   . Hyperlipidemia   . Gout   . Shortness of breath   . Heart murmur   . CHF (congestive heart failure)   . Atrial fibrillation     Current Outpatient Prescriptions  Medication Sig Dispense Refill  . acetaminophen (TYLENOL) 325 MG tablet Take 325 mg by mouth every 6 (six) hours as needed for pain.      Marland Kitchen allopurinol (ZYLOPRIM) 100 MG tablet Take 100 mg by mouth daily.       Marland Kitchen BIOTIN PO Take by mouth at bedtime.      . Calcium Carbonate-Vitamin D (CVS CALCIUM 600 + D PO) Take by mouth 2 (two) times daily.        Marland Kitchen diltiazem (TIAZAC) 360 MG 24 hr capsule Take 1 capsule (360 mg total) by mouth  daily.  90 capsule  3  . folic acid (FOLVITE) 1 MG tablet Take 1 mg by mouth daily.       Marland Kitchen KLOR-CON M20 20 MEQ tablet TAKE 1 TABLET TWICE A DAY  60 tablet  0  . latanoprost (XALATAN) 0.005 % ophthalmic solution 1 drop at bedtime.       . pantoprazole (PROTONIX) 40 MG tablet TAKE 1 TABLET BY MOUTH EVERY DAY AT 12 NOON  90 tablet  3  . torsemide (DEMADEX) 20 MG tablet TAKE 2 TABLETS BY MOUTH EVERY DAY  180 tablet  3  . warfarin (COUMADIN) 2.5 MG tablet Take as directed by Anticoagulation clinic  90 tablet  3   No current facility-administered medications for this visit.    Allergies:    Allergies  Allergen Reactions  . Hydralazine     hallucinations  . Metoprolol     Slow heart rate    Social History:  The patient  reports that she has never smoked. She has never used smokeless tobacco. She reports that she does not drink alcohol or use illicit drugs.   ROS:  Please see the history of present illness.   Appetite is good. No transient neurological symptoms or bleeding.   All other systems reviewed and  negative.   OBJECTIVE: VS:  BP 146/72  Pulse 76  Ht 5' (1.524 m)  Wt 145 lb (65.772 kg)  BMI 28.32 kg/m2 Well nourished, well developed, in no acute distress, elderly HEENT: normal Neck: JVD distended. Carotid bruit absent  Cardiac:  normal S1, S2; RRR; no murmur Lungs:  clear to auscultation bilaterally, no wheezing, rhonchi or rales Abd: soft, nontender, no hepatomegaly Ext: Edema lichenification of the lower extremities with 1+ edema. Pulses 1+ bilateral Skin: warm and dry Neuro:  CNs 2-12 intact, no focal abnormalities noted  EKG:  Not performed       Signed, Illene Labrador III, MD 11/29/2013 10:30 AM

## 2013-11-29 NOTE — Patient Instructions (Signed)
Your physician recommends that you continue on your current medications as directed. Please refer to the Current Medication list given to you today.  Continue to monitor your weight, follow a low sodium diet, and limit your fluid intake.  Your physician wants you to follow-up in: 6 months You will receive a reminder letter in the mail two months in advance. If you don't receive a letter, please call our office to schedule the follow-up appointment.

## 2013-12-01 ENCOUNTER — Ambulatory Visit (INDEPENDENT_AMBULATORY_CARE_PROVIDER_SITE_OTHER): Payer: Medicare Other | Admitting: General Surgery

## 2013-12-01 ENCOUNTER — Other Ambulatory Visit (INDEPENDENT_AMBULATORY_CARE_PROVIDER_SITE_OTHER): Payer: Self-pay

## 2013-12-01 ENCOUNTER — Encounter (INDEPENDENT_AMBULATORY_CARE_PROVIDER_SITE_OTHER): Payer: Self-pay | Admitting: General Surgery

## 2013-12-01 VITALS — BP 136/84 | HR 78 | Temp 98.0°F | Resp 18 | Ht 60.0 in | Wt 139.0 lb

## 2013-12-01 DIAGNOSIS — D0591 Unspecified type of carcinoma in situ of right breast: Secondary | ICD-10-CM

## 2013-12-01 DIAGNOSIS — C50919 Malignant neoplasm of unspecified site of unspecified female breast: Secondary | ICD-10-CM

## 2013-12-01 DIAGNOSIS — C50911 Malignant neoplasm of unspecified site of right female breast: Secondary | ICD-10-CM

## 2013-12-01 NOTE — Patient Instructions (Signed)
Examination of both breasts and all of the lymph node areas today is normal. There is no evidence of cancer.  You are due for mammograms. We will schedule these for you to be done now at [facility].  Keep your appointment with Dr. Jana Hakim this month  Return to see Dr. Dalbert Batman in one year after you get your annual mammograms.

## 2013-12-01 NOTE — Progress Notes (Signed)
Patient ID: [name], female   DOB: Mar 09, 1934, 80 y.o.   MRN: [id number]   History: This patient returns for long-term followup regarding her right breast cancer.  On 11/16/2010 she underwent a right partial mastectomy for a triple negative breast cancer, T1c,N0. We did not perform a sentinel lymph node because her comorbidities were too significant for her to consider chemotherapy. Radiation therapy was completed in October 2012. She has no known recurrence to date.  She is followed by Dr. Gunnar Bulla Magrinat and Dr. Daneen Schick.  Last mammograms were at [facility] on 10/01/2012 are Category 2, no focal abnormality bilaterally.  She has not had mammograms this year.  She thinks that she forgot. She states she is scheduled to see Dr. Jana Hakim this month. She has no complaints about her breasts.   ROS: 10 system review of systems is negative except as described above.   Family history, social history are unchanged from last year   Exam:  Patient is alert. Ambulates with a cane. No distress  Neck: No adenopathy mass or JV distention  Lungs: Clear auscultation. Well-healed sternotomy scar  Heart: Regular rate and rhythm. Aortic sounds prominent  Breast: Right breast shows radiation change with reduction in size some mild skin thickening and tanning, is nontender. The breast is soft. No palpable mass or axillary adenopathy. Good range of motion right shoulder. Left breast is atrophic and ptotic, no palpable mass or skin change or adenopathy.   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  No evidence of recurrence three years following right partial mastectomy, needle localization and adjuvant radiation therapy.  Overdue for annual mammography. Atrial fibrillation on Coumadin  Status post aortic valve replacement  Congestive heart failure  Chronic renal insufficiency.   Plan: Will schedule for  bilateral mammograms at [facility] now  Return to see me in one year after she gets her annual mammograms  Continue regular followup with Dr. Jana Hakim, her primary care physician, and her cardiologist.     Edsel Petrin. Dalbert Batman, M.D., [facility]  General and Minimally invasive Surgery  Breast and Colorectal Surgery  Office: [phone]  Pager: [phone]

## 2013-12-07 ENCOUNTER — Telehealth: Payer: Self-pay

## 2013-12-07 NOTE — Telephone Encounter (Signed)
Signed order for dx mammogram faxed to [facility].  Sent to scan.

## 2013-12-13 ENCOUNTER — Telehealth: Payer: Self-pay | Admitting: Nurse Practitioner

## 2013-12-13 ENCOUNTER — Encounter: Payer: Self-pay | Admitting: Nurse Practitioner

## 2013-12-13 ENCOUNTER — Ambulatory Visit (HOSPITAL_BASED_OUTPATIENT_CLINIC_OR_DEPARTMENT_OTHER): Payer: 59 | Admitting: Nurse Practitioner

## 2013-12-13 ENCOUNTER — Other Ambulatory Visit (HOSPITAL_BASED_OUTPATIENT_CLINIC_OR_DEPARTMENT_OTHER): Payer: 59

## 2013-12-13 VITALS — BP 155/63 | HR 71 | Temp 97.6°F | Resp 17 | Ht 60.0 in | Wt 147.4 lb

## 2013-12-13 DIAGNOSIS — C50419 Malignant neoplasm of upper-outer quadrant of unspecified female breast: Secondary | ICD-10-CM

## 2013-12-13 DIAGNOSIS — C50911 Malignant neoplasm of unspecified site of right female breast: Secondary | ICD-10-CM

## 2013-12-13 DIAGNOSIS — C50919 Malignant neoplasm of unspecified site of unspecified female breast: Secondary | ICD-10-CM

## 2013-12-13 DIAGNOSIS — Z171 Estrogen receptor negative status [ER-]: Secondary | ICD-10-CM

## 2013-12-13 DIAGNOSIS — I4891 Unspecified atrial fibrillation: Secondary | ICD-10-CM

## 2013-12-13 DIAGNOSIS — I10 Essential (primary) hypertension: Secondary | ICD-10-CM

## 2013-12-13 LAB — CBC WITH DIFFERENTIAL/PLATELET
BASO%: 0.8 % (ref 0.0–2.0)
BASOS ABS: 0.1 10*3/uL (ref 0.0–0.1)
EOS%: 1.7 % (ref 0.0–7.0)
Eosinophils Absolute: 0.1 10*3/uL (ref 0.0–0.5)
HCT: 32.9 % — ABNORMAL LOW (ref 34.8–46.6)
HEMOGLOBIN: 10.7 g/dL — AB (ref 11.6–15.9)
LYMPH#: 0.7 10*3/uL — AB (ref 0.9–3.3)
LYMPH%: 8.5 % — ABNORMAL LOW (ref 14.0–49.7)
MCH: 30.3 pg (ref 25.1–34.0)
MCHC: 32.5 g/dL (ref 31.5–36.0)
MCV: 93.3 fL (ref 79.5–101.0)
MONO#: 0.6 10*3/uL (ref 0.1–0.9)
MONO%: 6.8 % (ref 0.0–14.0)
NEUT#: 6.9 10*3/uL — ABNORMAL HIGH (ref 1.5–6.5)
NEUT%: 82.2 % — ABNORMAL HIGH (ref 38.4–76.8)
Platelets: 154 10*3/uL (ref 145–400)
RBC: 3.53 10*6/uL — ABNORMAL LOW (ref 3.70–5.45)
RDW: 15.3 % — ABNORMAL HIGH (ref 11.2–14.5)
WBC: 8.4 10*3/uL (ref 3.9–10.3)

## 2013-12-13 LAB — COMPREHENSIVE METABOLIC PANEL (CC13)
ALBUMIN: 3.6 g/dL (ref 3.5–5.0)
ALT: 18 U/L (ref 0–55)
AST: 23 U/L (ref 5–34)
Alkaline Phosphatase: 162 U/L — ABNORMAL HIGH (ref 40–150)
Anion Gap: 10 mEq/L (ref 3–11)
BUN: 48.9 mg/dL — AB (ref 7.0–26.0)
CALCIUM: 9.1 mg/dL (ref 8.4–10.4)
CHLORIDE: 106 meq/L (ref 98–109)
CO2: 25 meq/L (ref 22–29)
Creatinine: 1.8 mg/dL — ABNORMAL HIGH (ref 0.6–1.1)
GLUCOSE: 85 mg/dL (ref 70–140)
POTASSIUM: 4.5 meq/L (ref 3.5–5.1)
Sodium: 141 mEq/L (ref 136–145)
TOTAL PROTEIN: 7.3 g/dL (ref 6.4–8.3)
Total Bilirubin: 0.42 mg/dL (ref 0.20–1.20)

## 2013-12-13 NOTE — Telephone Encounter (Signed)
, °

## 2013-12-13 NOTE — Progress Notes (Signed)
ID: [name]   DOB: [birth year]  MR#: [id number]  XVQ#:[id number]  PCP: Lauraine Rinne, MD GYN: SUFanny Skates OTHER MD: Daneen Schick, Arloa Koh  CHIEF COMPLAINT: right breast cancer CURRENT TREATMENT: observation only  BREAST CANCER HISTORY: The patient had routine yearly screening mammography Sep 24, 2010, at [place].  This suggested a possible asymmetry in the right breast, so the patient proceeded to additional views October 01, 2010.  The mass noted in screening persisted on additional images.  There were irregular margins.  By ultrasonography, this was an irregular hypoechoic mass measuring 1.5 cm.  There was no evidence of axillary adenopathy.  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  The MIB-1 was 90%.   With this information, the patient was referred to Dr. Dalbert Batman and bilateral breast MRIs were obtained October 15, 2010, at [facility].  This confirmed a 1.4-cm irregular enhancing mass in the upper central right breast.  There were no other suspicious areas, no axillary or internal mammary chain adenopathy, and no suspicious enhancement or changes in the left breast.  Incidental note is of a 4.5-cm left hepatic cyst and 2 smaller likely liver cysts were noted. Her subsequent history is as detailed below.  INTERVAL HISTORY: [name] returns today accompanied by her daughter, [name]. The interval history is unremarkable besides a fall during Christmas of last year. She fractured her left shoulder, but it has healed and is not giving her any trouble now.    REVIEW OF SYSTEMS: Sorina denies fevers, chills, nausea, vomiting, bowel or bladder changes, shortness of breath, or abdominal chest pain. A detailed review of systems was otherwise noncontributory.   PAST MEDICAL HISTORY: Past  Medical History  Diagnosis Date  . Hypertension   . GERD (gastroesophageal reflux disease)   . Glaucoma   . Cataract   . Breast cancer     right  . Valvular heart disease   . Hyperlipidemia   . Gout   . Shortness of breath   . Heart murmur   . CHF (congestive heart failure)   . Atrial fibrillation   Significant for cardiac valve repair/replacement in 2001.  I do not have those records.  The patient has been on Coumadin since that time, followed through [facility].  She has cardiomegaly by MRI.  She has a history of hypertension, cataracts, glaucoma, and she has problems with ambulation due to damage to her feet, felt to be secondary to working on a concrete floor for many years with inadequate shoes.  She is status post appendectomy and status post bilateral tubal ligation.  There is also a history of atrial fibrillation and history of diastolic dysfunction with some evidence of failure, history of hyperlipidemia, history of chronic venous insufficiency, and history of acute renal failure (please refer to the admission history by Dr. Jonelle Sidle in [place]).  PAST SURGICAL HISTORY: Past Surgical History  Procedure Laterality Date  . Cardiac valve replacement  2001    per medical history form dated 10/21/10  . Appendectomy  1980's    Per patient son. not sure of date.  . Tubal ligation    . Breast surgery  2012    Right br. Lumpectomy    FAMILY HISTORY Family History  Problem Relation Age of Onset  . Hypertension Mother   . Other Mother  Alzheimers  . Cancer Father     leukemia  . Hypertension Brother   . Other Brother     breathing problems  The patient's mother died age 36 with ALZ disease.  The patient's father died from leukemia at the age of 56.  The patient had no sisters.  She had 3 brothers.  One died, but she does not know the cause.  The other 2 are living; 1 has emphysema, 1 has gout and high blood pressure.  GYNECOLOGIC HISTORY: She is Gx P6.  First pregnancy  to term age 22.  She does not remember when she had menarche.  She went through the change of life around age 67.  She never took hormone replacement.  SOCIAL HISTORY: She used to work as a Sports coach at [employer].  She is widowed, her husband dying in 2011 from emphysema problems.  At home her son, [name] sometimes stays.  He is disabled secondary to burns.  With her today is her son, [name], who works as a Dealer.  His cell number is [phone].  The patient's daughter, [name] works in Charity fundraiser.  Her home number is [phone].  In case of an emergency, [name] would want [place] to contact one or both of them.  [name] has 5 grandchildren.  She attends [employer].   ADVANCED DIRECTIVES: In place  HEALTH MAINTENANCE: History  Substance Use Topics  . Smoking status: Never Smoker   . Smokeless tobacco: Never Used  . Alcohol Use: No     Colonoscopy:  PAP:  Bone density:  Lipid panel:  Allergies  Allergen Reactions  . Hydralazine     hallucinations  . Metoprolol     Slow heart rate    Current Outpatient Prescriptions  Medication Sig Dispense Refill  . acetaminophen (TYLENOL) 500 MG tablet Take 500 mg by mouth every 6 (six) hours as needed.      Marland Kitchen allopurinol (ZYLOPRIM) 100 MG tablet Take 100 mg by mouth daily.       . Calcium Carbonate-Vitamin D (CVS CALCIUM 600 + D PO) Take by mouth 2 (two) times daily.        Marland Kitchen diltiazem (TIAZAC) 360 MG 24 hr capsule Take 1 capsule (360 mg total) by mouth daily.  90 capsule  3  . ferrous gluconate (FERGON) 240 (27 FE) MG tablet Take 240 mg by mouth every morning.      . folic acid (FOLVITE) 1 MG tablet Take 1 mg by mouth daily.       Marland Kitchen KLOR-CON M20 20 MEQ tablet TAKE 1 TABLET TWICE A DAY  60 tablet  0  . latanoprost (XALATAN) 0.005 % ophthalmic solution 1 drop at bedtime.       . pantoprazole (PROTONIX) 40 MG tablet TAKE 1 TABLET BY MOUTH EVERY DAY AT 12 NOON  90 tablet  3  . torsemide (DEMADEX) 20 MG  tablet TAKE 2 TABLETS BY MOUTH EVERY DAY  180 tablet  3  . traMADol (ULTRAM) 50 MG tablet Take 50 mg by mouth every 6 (six) hours as needed.      . warfarin (COUMADIN) 2.5 MG tablet Take as directed by Anticoagulation clinic  90 tablet  3  . BIOTIN PO Take 1,000 mcg by mouth at bedtime.        No current facility-administered medications for this visit.    OBJECTIVE: Elderly African American woman in no acute distress Filed Vitals:   12/13/13 0943  BP:  155/63  Pulse: 71  Temp: 97.6 F (36.4 C)  Resp: 17     Body mass index is 28.79 kg/(m^2).    ECOG FS: 0 Filed Weights   12/13/13 0943  Weight: 147 lb 6.4 oz (66.86 kg)    Skin: warm, dry  HEENT: sclerae anicteric, conjunctivae pink, oropharynx clear. No thrush or mucositis.  Lymph Nodes: No cervical or supraclavicular lymphadenopathy  Lungs: clear to auscultation bilaterally, no rales, wheezes, or rhonci  Heart: regular rate and rhythm  Abdomen: round, soft, non tender, positive bowel sounds  Musculoskeletal: No focal spinal tenderness, +1 bilateral lower extremity edema  Neuro: non focal, well oriented, positive affect  Breast: right breast status post lumpectomy and radiation. No skin or nipple changes. No evidence of local recurrence. Right axilla benign. Left breast unremarkable  LAB RESULTS: Lab Results  Component Value Date   WBC 8.4 12/13/2013   NEUTROABS 6.9* 12/13/2013   HGB 10.7* 12/13/2013   HCT 32.9* 12/13/2013   MCV 93.3 12/13/2013   PLT 154 12/13/2013      Chemistry      Component Value Date/Time   NA 141 12/13/2013 0926   NA 133* 11/08/2012 0610   K 4.5 12/13/2013 0926   K 4.6 11/08/2012 0610   CL 98 11/08/2012 0610   CO2 25 12/13/2013 0926   CO2 25 11/08/2012 0610   BUN 48.9* 12/13/2013 0926   BUN 47* 11/08/2012 0610   CREATININE 1.8* 12/13/2013 0926   CREATININE 1.59* 11/08/2012 0610      Component Value Date/Time   CALCIUM 9.1 12/13/2013 0926   CALCIUM 9.3 11/08/2012 0610   ALKPHOS 162* 12/13/2013 0926    ALKPHOS 167* 10/30/2012 0024   AST 23 12/13/2013 0926   AST 23 10/30/2012 0024   ALT 18 12/13/2013 0926   ALT 16 10/30/2012 0024   BILITOT 0.42 12/13/2013 0926   BILITOT 0.7 10/30/2012 0024       Lab Results  Component Value Date   LABCA2 32 10/24/2010     STUDIES:   Mammography at [facility] 10/01/2012 was unremarkable.  CT abd/pelvis 10/29/2012 showed SBO but no metastatic disease   ASSESSMENT: 80 y.o. [place] woman   (1)  status post right breast upper central lumpectomy with no sentinel lymph node sampling July 2012 for a T1c NX high-grade invasive ductal carcinoma, triple negative, with an MIB-1 of 90%.   (2)  She completed adjuvant radiation October 2012.  (3) Because of her comorbidities and age, the Adjuvant! Program would calculate her benefit from chemotherapy in the 2% range even if she had positive lymph nodes.  For this reason, we did not think chemotherapy would be useful for her in the adjuvant setting and we did not request sentinel lymph node information or port placement.   PLAN: Ronella is doing well as far as her breast cancer is concerned. She is now 3 years out from definitive surgery with no evidence of disease recurrence. The labs were reviewed in detail with the patient and were stable. Her next mammogram is actually tomorrow.   The plan is to continue observation yearly for a total of 5 years post surgery. Vista understands and agrees with this plan. She has been encouraged to call with any issues that might arise before her next visit.   Marcelino Duster    12/13/2013

## 2013-12-16 ENCOUNTER — Ambulatory Visit (INDEPENDENT_AMBULATORY_CARE_PROVIDER_SITE_OTHER): Payer: 59 | Admitting: *Deleted

## 2013-12-16 DIAGNOSIS — I4891 Unspecified atrial fibrillation: Secondary | ICD-10-CM

## 2013-12-16 DIAGNOSIS — Z5181 Encounter for therapeutic drug level monitoring: Secondary | ICD-10-CM

## 2013-12-16 LAB — POCT INR: INR: 1.9

## 2013-12-20 ENCOUNTER — Telehealth: Payer: Self-pay

## 2013-12-20 NOTE — Telephone Encounter (Signed)
pt aware of lab results.  Labs are ok. pt verbalized understanding.

## 2013-12-20 NOTE — Telephone Encounter (Signed)
Message copied by Lamar Laundry on Tue Dec 20, 2013 10:20 AM ------      Message from: Daneen Schick      Created: Fri Dec 16, 2013  9:08 PM       Labs are ok. ------

## 2013-12-30 ENCOUNTER — Ambulatory Visit (INDEPENDENT_AMBULATORY_CARE_PROVIDER_SITE_OTHER): Payer: 59 | Admitting: Pharmacist

## 2013-12-30 DIAGNOSIS — Z5181 Encounter for therapeutic drug level monitoring: Secondary | ICD-10-CM

## 2013-12-30 DIAGNOSIS — I4891 Unspecified atrial fibrillation: Secondary | ICD-10-CM

## 2013-12-30 LAB — POCT INR: INR: 3.4

## 2014-01-13 ENCOUNTER — Ambulatory Visit (INDEPENDENT_AMBULATORY_CARE_PROVIDER_SITE_OTHER): Payer: 59 | Admitting: Pharmacist

## 2014-01-13 DIAGNOSIS — Z5181 Encounter for therapeutic drug level monitoring: Secondary | ICD-10-CM

## 2014-01-13 DIAGNOSIS — I4891 Unspecified atrial fibrillation: Secondary | ICD-10-CM

## 2014-01-13 LAB — POCT INR: INR: 2.8

## 2014-02-03 ENCOUNTER — Ambulatory Visit (INDEPENDENT_AMBULATORY_CARE_PROVIDER_SITE_OTHER): Payer: 59 | Admitting: *Deleted

## 2014-02-03 DIAGNOSIS — I4891 Unspecified atrial fibrillation: Secondary | ICD-10-CM

## 2014-02-03 DIAGNOSIS — Z5181 Encounter for therapeutic drug level monitoring: Secondary | ICD-10-CM

## 2014-02-03 LAB — POCT INR: INR: 2.4

## 2014-02-06 ENCOUNTER — Other Ambulatory Visit: Payer: Self-pay | Admitting: Nephrology

## 2014-02-06 DIAGNOSIS — N183 Chronic kidney disease, stage 3 unspecified: Secondary | ICD-10-CM

## 2014-02-06 DIAGNOSIS — N179 Acute kidney failure, unspecified: Secondary | ICD-10-CM

## 2014-02-09 ENCOUNTER — Other Ambulatory Visit: Payer: 59

## 2014-02-16 ENCOUNTER — Ambulatory Visit
Admission: RE | Admit: 2014-02-16 | Discharge: 2014-02-16 | Disposition: A | Discharge status: Home / Self Care | Payer: 59 | Source: Ambulatory Visit | Attending: Nephrology | Admitting: Nephrology

## 2014-02-16 DIAGNOSIS — N179 Acute kidney failure, unspecified: Secondary | ICD-10-CM

## 2014-02-16 DIAGNOSIS — N183 Chronic kidney disease, stage 3 unspecified: Secondary | ICD-10-CM

## 2014-03-01 ENCOUNTER — Other Ambulatory Visit: Payer: Self-pay | Admitting: *Deleted

## 2014-03-01 MED ORDER — WARFARIN SODIUM 2.5 MG PO TABS: ORAL_TABLET | ORAL | Status: DC

## 2014-03-01 NOTE — Telephone Encounter (Signed)
Refill done as requested 

## 2014-03-03 ENCOUNTER — Ambulatory Visit (INDEPENDENT_AMBULATORY_CARE_PROVIDER_SITE_OTHER): Payer: 59 | Admitting: *Deleted

## 2014-03-03 DIAGNOSIS — I4891 Unspecified atrial fibrillation: Secondary | ICD-10-CM

## 2014-03-03 DIAGNOSIS — Z5181 Encounter for therapeutic drug level monitoring: Secondary | ICD-10-CM

## 2014-03-03 LAB — POCT INR: INR: 3.1

## 2014-03-17 ENCOUNTER — Ambulatory Visit (INDEPENDENT_AMBULATORY_CARE_PROVIDER_SITE_OTHER): Payer: 59 | Admitting: Pharmacist

## 2014-03-17 DIAGNOSIS — Z5181 Encounter for therapeutic drug level monitoring: Secondary | ICD-10-CM

## 2014-03-17 DIAGNOSIS — I4891 Unspecified atrial fibrillation: Secondary | ICD-10-CM

## 2014-03-17 LAB — POCT INR: INR: 2.2

## 2014-04-13 ENCOUNTER — Other Ambulatory Visit: Payer: Self-pay | Admitting: Interventional Cardiology

## 2014-04-14 ENCOUNTER — Ambulatory Visit (INDEPENDENT_AMBULATORY_CARE_PROVIDER_SITE_OTHER): Payer: 59

## 2014-04-14 DIAGNOSIS — Z5181 Encounter for therapeutic drug level monitoring: Secondary | ICD-10-CM

## 2014-04-14 DIAGNOSIS — I4891 Unspecified atrial fibrillation: Secondary | ICD-10-CM

## 2014-04-14 LAB — POCT INR: INR: 1.4

## 2014-04-27 ENCOUNTER — Ambulatory Visit (INDEPENDENT_AMBULATORY_CARE_PROVIDER_SITE_OTHER): Payer: 59 | Admitting: Pharmacist

## 2014-04-27 DIAGNOSIS — Z5181 Encounter for therapeutic drug level monitoring: Secondary | ICD-10-CM

## 2014-04-27 DIAGNOSIS — I4891 Unspecified atrial fibrillation: Secondary | ICD-10-CM

## 2014-04-27 LAB — POCT INR: INR: 1.6

## 2014-05-11 ENCOUNTER — Ambulatory Visit (INDEPENDENT_AMBULATORY_CARE_PROVIDER_SITE_OTHER): Payer: Medicare Other

## 2014-05-11 DIAGNOSIS — I4891 Unspecified atrial fibrillation: Secondary | ICD-10-CM

## 2014-05-11 DIAGNOSIS — Z5181 Encounter for therapeutic drug level monitoring: Secondary | ICD-10-CM

## 2014-05-11 LAB — POCT INR: INR: 2.1

## 2014-05-25 ENCOUNTER — Other Ambulatory Visit: Payer: Self-pay | Admitting: Interventional Cardiology

## 2014-06-01 ENCOUNTER — Ambulatory Visit (INDEPENDENT_AMBULATORY_CARE_PROVIDER_SITE_OTHER): Payer: Medicare Other | Admitting: Pharmacist Clinician (PhC)/ Clinical Pharmacy Specialist

## 2014-06-01 DIAGNOSIS — I4891 Unspecified atrial fibrillation: Secondary | ICD-10-CM

## 2014-06-01 DIAGNOSIS — Z5181 Encounter for therapeutic drug level monitoring: Secondary | ICD-10-CM

## 2014-06-01 LAB — POCT INR: INR: 2.4

## 2014-06-06 ENCOUNTER — Ambulatory Visit: Payer: Self-pay | Admitting: Interventional Cardiology

## 2014-06-19 ENCOUNTER — Other Ambulatory Visit: Payer: Self-pay | Admitting: Interventional Cardiology

## 2014-06-20 ENCOUNTER — Other Ambulatory Visit: Payer: Self-pay

## 2014-06-25 ENCOUNTER — Other Ambulatory Visit: Payer: Self-pay | Admitting: Interventional Cardiology

## 2014-06-29 ENCOUNTER — Ambulatory Visit (INDEPENDENT_AMBULATORY_CARE_PROVIDER_SITE_OTHER): Payer: Medicare Other | Admitting: *Deleted

## 2014-06-29 DIAGNOSIS — Z5181 Encounter for therapeutic drug level monitoring: Secondary | ICD-10-CM

## 2014-06-29 DIAGNOSIS — I4891 Unspecified atrial fibrillation: Secondary | ICD-10-CM

## 2014-06-29 LAB — POCT INR: INR: 1.3

## 2014-07-03 ENCOUNTER — Other Ambulatory Visit: Payer: Self-pay

## 2014-07-03 MED ORDER — WARFARIN SODIUM 2.5 MG PO TABS: ORAL_TABLET | ORAL | Status: AC

## 2014-07-06 ENCOUNTER — Ambulatory Visit (INDEPENDENT_AMBULATORY_CARE_PROVIDER_SITE_OTHER): Payer: Medicare Other | Admitting: *Deleted

## 2014-07-06 DIAGNOSIS — Z5181 Encounter for therapeutic drug level monitoring: Secondary | ICD-10-CM | POA: Diagnosis not present

## 2014-07-06 DIAGNOSIS — I4891 Unspecified atrial fibrillation: Secondary | ICD-10-CM | POA: Diagnosis not present

## 2014-07-06 LAB — POCT INR: INR: 1.5

## 2014-07-13 ENCOUNTER — Ambulatory Visit (INDEPENDENT_AMBULATORY_CARE_PROVIDER_SITE_OTHER): Payer: Medicare Other

## 2014-07-13 DIAGNOSIS — Z5181 Encounter for therapeutic drug level monitoring: Secondary | ICD-10-CM

## 2014-07-13 DIAGNOSIS — I4891 Unspecified atrial fibrillation: Secondary | ICD-10-CM

## 2014-07-13 LAB — POCT INR: INR: 2.1

## 2014-07-27 ENCOUNTER — Ambulatory Visit (INDEPENDENT_AMBULATORY_CARE_PROVIDER_SITE_OTHER): Payer: Medicare Other | Admitting: *Deleted

## 2014-07-27 DIAGNOSIS — Z5181 Encounter for therapeutic drug level monitoring: Secondary | ICD-10-CM

## 2014-07-27 DIAGNOSIS — I4891 Unspecified atrial fibrillation: Secondary | ICD-10-CM | POA: Diagnosis not present

## 2014-07-27 LAB — POCT INR: INR: 2.1

## 2014-08-11 ENCOUNTER — Ambulatory Visit (INDEPENDENT_AMBULATORY_CARE_PROVIDER_SITE_OTHER): Payer: Medicare Other | Admitting: Pharmacist

## 2014-08-11 ENCOUNTER — Ambulatory Visit (INDEPENDENT_AMBULATORY_CARE_PROVIDER_SITE_OTHER): Payer: Medicare Other | Admitting: Interventional Cardiology

## 2014-08-11 ENCOUNTER — Encounter: Payer: Self-pay | Admitting: Interventional Cardiology

## 2014-08-11 VITALS — BP 162/62 | HR 78 | Ht 60.0 in | Wt 134.1 lb

## 2014-08-11 DIAGNOSIS — Z5181 Encounter for therapeutic drug level monitoring: Secondary | ICD-10-CM

## 2014-08-11 DIAGNOSIS — N183 Chronic kidney disease, stage 3 unspecified: Secondary | ICD-10-CM

## 2014-08-11 DIAGNOSIS — I480 Paroxysmal atrial fibrillation: Secondary | ICD-10-CM | POA: Diagnosis not present

## 2014-08-11 DIAGNOSIS — I5032 Chronic diastolic (congestive) heart failure: Secondary | ICD-10-CM | POA: Diagnosis not present

## 2014-08-11 DIAGNOSIS — Z7901 Long term (current) use of anticoagulants: Secondary | ICD-10-CM

## 2014-08-11 DIAGNOSIS — Z9889 Other specified postprocedural states: Secondary | ICD-10-CM

## 2014-08-11 DIAGNOSIS — I4891 Unspecified atrial fibrillation: Secondary | ICD-10-CM | POA: Diagnosis not present

## 2014-08-11 LAB — POCT INR: INR: 1.8

## 2014-08-11 NOTE — Progress Notes (Signed)
Cardiology Office Note   Date:  08/11/2014   ID:  [name], DOB 1933-08-03, MRN [id number]  PCP:  Lauraine Rinne, MD  Cardiologist:   Sinclair Grooms, MD   Chief Complaint  Patient presents with  . Mitral Valve Prolapse    MV repair      History of Present Illness: [name] is a 81 y.o. female who presents for evaluation of mitral valve repair (2001), chronic diastolic heart failure, pulmonary hypertension, chronic kidney disease IV, essential hypertension, chronic atrial fibrillation, and chronic anticoagulation with Coumadin.  His been more than 2 years since she was hospitalized with volume overload. She is careful with salt in her diet. She denies orthopnea and PND. Lower extremities swelling has been minimal. Chronic lichenification of the skin still exist but the skin is softening. She has not had anginal quality chest pain. She denies syncope.    Past Medical History  Diagnosis Date  . Hypertension   . GERD (gastroesophageal reflux disease)   . Glaucoma   . Cataract   . Breast cancer     right  . Valvular heart disease   . Hyperlipidemia   . Gout   . Shortness of breath   . Heart murmur   . CHF (congestive heart failure)   . Atrial fibrillation     Past Surgical History  Procedure Laterality Date  . Cardiac valve replacement  2001    per medical history form dated 10/21/10  . Appendectomy  1980's    Per patient son. not sure of date.  . Tubal ligation    . Breast surgery  2012    Right br. Lumpectomy     Current Outpatient Prescriptions  Medication Sig Dispense Refill  . acetaminophen (TYLENOL) 500 MG tablet Take 500 mg by mouth every 6 (six) hours as needed.    Marland Kitchen allopurinol (ZYLOPRIM) 100 MG tablet Take 100 mg by mouth daily.     Marland Kitchen BIOTIN PO Take 1,000 mcg by mouth at bedtime.     . Calcium Carbonate-Vitamin D (CVS CALCIUM 600 + D PO) Take by mouth 2 (two) times daily.      Marland Kitchen diltiazem (CARDIZEM CD) 360 MG 24 hr capsule  TAKE ONE CAPSULE BY MOUTH EVERY DAY 90 capsule 1  . ferrous gluconate (FERGON) 240 (27 FE) MG tablet Take 240 mg by mouth every morning.    . folic acid (FOLVITE) 1 MG tablet Take 1 mg by mouth daily.     Marland Kitchen KLOR-CON M20 20 MEQ tablet TAKE 1 TABLET TWICE A DAY 60 tablet 1  . latanoprost (XALATAN) 0.005 % ophthalmic solution 1 drop at bedtime.     . torsemide (DEMADEX) 20 MG tablet TAKE 2 TABLETS BY MOUTH EVERY DAY 180 tablet 1  . traMADol (ULTRAM) 50 MG tablet Take 50 mg by mouth every 6 (six) hours as needed.    . warfarin (COUMADIN) 2.5 MG tablet Take as directed by Anticoagulation clinic 90 tablet 3   No current facility-administered medications for this visit.    Allergies:   Hydralazine and Metoprolol    Social History:  The patient  reports that she has never smoked. She has never used smokeless tobacco. She reports that she does not drink alcohol or use illicit drugs.   Family History:  The patient's family history includes Cancer in her father; Hypertension in her brother and mother; Other in her brother and mother.    ROS:  Please see the history of  present illness.   Otherwise, review of systems are positive for improved appetite, weight loss, and intermittent swelling in her ankles..   All other systems are reviewed and negative.    PHYSICAL EXAM: VS:  BP 162/62 mmHg  Pulse 78  Ht 5' (1.524 m)  Wt 134 lb 1.9 oz (60.836 kg)  BMI 26.19 kg/m2 , BMI Body mass index is 26.19 kg/(m^2). GEN: Well nourished, well developed, in no acute distress HEENT: normal Neck: no JVD, carotid bruits, or masses Cardiac: IIRR; a grade 2 to 3/6 holosystolic apical murmur of mitral regurgitation is heard radiating into the left axilla. There is a 2/6 decrescendo holodiastolic murmur of aortic regurgitation. There are no rubs, or gallops. There is 1-2+ lower extremity edema markedly improved compared to other evaluations. Lichenification of the skin is noted from chronic stasis dermatitis.    Respiratory:  clear to auscultation bilaterally, normal work of breathing GI: soft, nontender, nondistended, + BS MS: no deformity or atrophy Skin: warm and dry, no rash Neuro:  Strength and sensation are intact Psych: euthymic mood, full affect   EKG:  EKG is ordered today. The ekg ordered today demonstrates atrial fibrillation rare PVC. Control ventricular response. No evidence of infarction or ventricular hypertrophy.   Recent Labs: 12/13/2013: ALT 18; BUN 48.9*; Creatinine 1.8*; Hemoglobin 10.7*; Platelets 154; Potassium 4.5; Sodium 141    Lipid Panel    Component Value Date/Time   CHOL 126 10/25/2010 0146   TRIG 55 10/25/2010 0146   HDL 43 10/25/2010 0146   CHOLHDL 2.9 10/25/2010 0146   VLDL 11 10/25/2010 0146   LDLCALC 72 10/25/2010 0146      Wt Readings from Last 3 Encounters:  08/11/14 134 lb 1.9 oz (60.836 kg)  12/13/13 147 lb 6.4 oz (66.86 kg)  12/01/13 139 lb (63.05 kg)      Other studies Reviewed: Additional studies/ records that were reviewed today include: Reviewed nephrology information that his been faxed. No recent laboratory data is available but is being followed by Dr. Marval Regal.   ASSESSMENT AND PLAN:  Chronic atrial fibrillation: Stable with good rate control  Chronic anticoagulation: No recent bleeding  Chronic diastolic heart failure: Stable without evidence of volume overload  Chronic kidney disease, stage 3, mod decreased GFR. Followed by Dr.Coladonato  H/O mitral valve repair: Unchanged clinical exam with persistent mitral regurgitation noted     Current medicines are reviewed at length with the patient today.  The patient does not have concerns regarding medicines.  The following changes have been made:  no change  Labs/ tests ordered today include:   Orders Placed This Encounter  Procedures  . EKG 12-Lead     Disposition:   FU with Linard Millers in 8 months   Signed, Sinclair Grooms, MD  08/11/2014 4:32 PM    [facility] [address], [place], [place]  [address] Phone: [phone]; Fax: [phone]

## 2014-08-11 NOTE — Patient Instructions (Signed)
Medication Instructions:  Your physician recommends that you continue on your current medications as directed. Please refer to the Current Medication list given to you today.  Labwork: No new orders.  Testing/Procedures: No new orders.  Follow-Up: Your physician wants you to follow-up in: 46 MONTHS with Dr Tamala Julian.  You will receive a reminder letter in the mail two months in advance. If you don't receive a letter, please call our office to schedule the follow-up appointment.  Any Other Special Instructions Will Be Listed Below (If Applicable).

## 2014-08-26 ENCOUNTER — Emergency Department (HOSPITAL_COMMUNITY)
Admission: EM | Admit: 2014-08-26 | Discharge: 2014-08-27 | Disposition: E | Payer: Medicare Other | Attending: Emergency Medicine | Admitting: Emergency Medicine

## 2014-08-26 ENCOUNTER — Encounter (HOSPITAL_COMMUNITY): Payer: Self-pay | Admitting: Emergency Medicine

## 2014-08-26 DIAGNOSIS — I4891 Unspecified atrial fibrillation: Secondary | ICD-10-CM | POA: Diagnosis not present

## 2014-08-26 DIAGNOSIS — K219 Gastro-esophageal reflux disease without esophagitis: Secondary | ICD-10-CM | POA: Insufficient documentation

## 2014-08-26 DIAGNOSIS — Z7901 Long term (current) use of anticoagulants: Secondary | ICD-10-CM | POA: Diagnosis not present

## 2014-08-26 DIAGNOSIS — I469 Cardiac arrest, cause unspecified: Secondary | ICD-10-CM | POA: Insufficient documentation

## 2014-08-26 DIAGNOSIS — H409 Unspecified glaucoma: Secondary | ICD-10-CM | POA: Diagnosis not present

## 2014-08-26 DIAGNOSIS — R011 Cardiac murmur, unspecified: Secondary | ICD-10-CM | POA: Insufficient documentation

## 2014-08-26 DIAGNOSIS — Z79899 Other long term (current) drug therapy: Secondary | ICD-10-CM | POA: Insufficient documentation

## 2014-08-26 DIAGNOSIS — M109 Gout, unspecified: Secondary | ICD-10-CM | POA: Insufficient documentation

## 2014-08-26 DIAGNOSIS — Z853 Personal history of malignant neoplasm of breast: Secondary | ICD-10-CM | POA: Insufficient documentation

## 2014-08-26 DIAGNOSIS — I10 Essential (primary) hypertension: Secondary | ICD-10-CM | POA: Insufficient documentation

## 2014-08-26 MED ORDER — EPINEPHRINE HCL 0.1 MG/ML IJ SOSY
PREFILLED_SYRINGE | INTRAMUSCULAR | Status: AC | PRN
  Administered 2014-08-26: 1 mg via INTRAVENOUS

## 2014-08-27 MED FILL — Medication: Qty: 1 | Status: AC

## 2014-08-27 NOTE — ED Provider Notes (Signed)
CSN: [id number]     Arrival date & time 2014/09/20  1535 History   First MD Initiated Contact with Patient 09-20-14 1545     Chief Complaint  Patient presents with  . Cardiac Arrest     (Consider location/radiation/quality/duration/timing/severity/associated sxs/prior Treatment) The history is provided by the EMS personnel.  [name] is a 81 y.o. female history of breast cancer, heart failure, A. Fib on Coumadin, valvular heart disease status post open heart surgery here presenting with cardiac arrest. Found unresponsive around 2 PM by her brother. EMS was subsequently called. Found her in PEA. She was given over 15 epinephrine, and also given a dose of calcium. Patient went into V. Fib and was shocked 5 times and loaded with 300 mg of amiodarone. She never had spontaneous return of circulation.    Level v caveat- condition of patient   Past Medical History  Diagnosis Date  . Hypertension   . GERD (gastroesophageal reflux disease)   . Glaucoma   . Cataract   . Breast cancer     right  . Valvular heart disease   . Hyperlipidemia   . Gout   . Shortness of breath   . Heart murmur   . CHF (congestive heart failure)   . Atrial fibrillation    Past Surgical History  Procedure Laterality Date  . Cardiac valve replacement  2001    per medical history form dated 10/21/10  . Appendectomy  1980's    Per patient son. not sure of date.  . Tubal ligation    . Breast surgery  2012    Right br. Lumpectomy   Family History  Problem Relation Age of Onset  . Hypertension Mother   . Other Mother     Alzheimers  . Cancer Father     leukemia  . Hypertension Brother   . Other Brother     breathing problems   History  Substance Use Topics  . Smoking status: Never Smoker   . Smokeless tobacco: Never Used  . Alcohol Use: No   OB History    No data available     Review of Systems  Unable to perform ROS: Acuity of condition      Allergies  Hydralazine and  Metoprolol  Home Medications   Prior to Admission medications   Medication Sig Start Date End Date Taking? Authorizing Provider  acetaminophen (TYLENOL) 500 MG tablet Take 500 mg by mouth every 6 (six) hours as needed.    Historical Provider, MD  allopurinol (ZYLOPRIM) 100 MG tablet Take 100 mg by mouth daily.  01/10/13   Historical Provider, MD  BIOTIN PO Take 1,000 mcg by mouth at bedtime.     Historical Provider, MD  Calcium Carbonate-Vitamin D ([employer] CALCIUM 600 + D PO) Take by mouth 2 (two) times daily.      Historical Provider, MD  diltiazem (CARDIZEM CD) 360 MG 24 hr capsule TAKE ONE CAPSULE BY MOUTH EVERY DAY 06/20/14   Belva Crome, MD  ferrous gluconate (FERGON) 240 (27 FE) MG tablet Take 240 mg by mouth every morning.    Historical Provider, MD  folic acid (FOLVITE) 1 MG tablet Take 1 mg by mouth daily.  10/11/13   Historical Provider, MD  KLOR-CON M20 20 MEQ tablet TAKE 1 TABLET TWICE A DAY 06/26/14   Belva Crome, MD  latanoprost (XALATAN) 0.005 % ophthalmic solution 1 drop at bedtime.  09/07/13   Historical Provider, MD  torsemide (DEMADEX) 20 MG tablet  TAKE 2 TABLETS BY MOUTH EVERY DAY 06/20/14   Belva Crome, MD  traMADol (ULTRAM) 50 MG tablet Take 50 mg by mouth every 6 (six) hours as needed.    Historical Provider, MD  warfarin (COUMADIN) 2.5 MG tablet Take as directed by Anticoagulation clinic 07/03/14   Belva Crome, MD   There were no vitals taken for this visit. Physical Exam  Constitutional:  Unresponsive, cardiac arrest   HENT:  Head: Normocephalic.  Mouth/Throat: Oropharynx is clear and moist.  Eyes: Conjunctivae are normal. Pupils are equal, round, and reactive to light.  Neck: Normal range of motion. Neck supple.  Cardiovascular: Normal rate, regular rhythm and normal heart sounds.   Pulmonary/Chest: Effort normal and breath sounds normal.  ET tube in place. Bilateral breath sounds. Capnography around 20   Abdominal: Soft. Bowel sounds are normal. She exhibits no  distension. There is no tenderness. There is no rebound.  Musculoskeletal:  Not moving extremities   Neurological:  Lethargic, not moving extremities   Skin: Skin is dry.  Psychiatric:  Unable   Nursing note and vitals reviewed.   ED Course  Procedures (including critical care time)  Cardiopulmonary Resuscitation (CPR) Procedure Note Directed/Performed by: Shirlyn Goltz I personally directed ancillary staff and/or performed CPR in an effort to regain return of spontaneous circulation and to maintain cardiac, neuro and systemic perfusion.      EMERGENCY DEPARTMENT [place] CARDIAC EXAM "Study: Limited Ultrasound of the heart and pericardium"  INDICATIONS:Cardiac arrest Multiple views of the heart and pericardium are obtained with a multi-frequency probe.  PERFORMED GN:OIBBCW  IMAGES ARCHIVED?: No  FINDINGS: No cardiac activity  LIMITATIONS:  Body habitus  VIEWS USED: Subcostal 4 chamber  INTERPRETATION: Cardiac activity absent  COMMENT:  No cardiac activity    Labs Review Labs Reviewed - No data to display  Imaging Review No results found.   EKG Interpretation None      MDM   Final diagnoses:  Cardiac arrest    [name] is a 81 y.o. female here with cardiac arrest. Remained in PEA in the ED. EMS has already started ACLS for more than a hour. Beside US showed no cardiac activity. Has cardiac hx. I updated family. Called Dr. Tollie Pizza from [facility] practice. He will fill out death certificate.      Wandra Arthurs, MD 09/17/14 [phone]

## 2014-08-27 NOTE — Progress Notes (Signed)
RT responded to CPR in progress call. Pt intubated with 8.0 ett in field. Pt manually ventilated throughout code. Pt suctioned x2 with 14 french suction catheter down ett once in trauma C due to moderate amount of thin hemoptysis and difficulty ventilating. ETCO2 10-11 throughout code while in trauma C. Pt expired at 1545. ETT remains in place per Dr Darl Householder. Washcloth taped over ett opening due to blood. RN aware.

## 2014-08-27 NOTE — Progress Notes (Addendum)
Chaplain paged to be with family.   Family very distressed wrestling with the sudden death of their matriarch [name]. Large number of family and close friends gathered supporting one another.   Family grieved heavily. Her son [name] mentioned that his mother "was tired, and we could tell from aging with her health complications, I knew she'd be going to rest soon, but not this suddenly"  Family pastor arrived and walked with some of the heavier grieving members outside to catch some air.   Arrangements and contact of a son [name] and a daughter [name] given to nurse. No other family expected to visit.   Chaplain signing off.   Delford Field, Chaplain Sep 25, 2014

## 2014-08-27 NOTE — Code Documentation (Signed)
PER EMS- PT FOUND DOWN BY NEIGHBOR AT APPROX 1400. PT FOUND TO BE IN PEA UPON EMS ARRIVAL. PT ALSO IN V-FIB FOR 35 MINS. PT HAS RECEIVED A TOTAL OF 14 EPI, 4MG  NARCAN, 450MG  AMIODARONE, 2G MAG, 1G CA+. PT WITH SIGNIFICANT CARDIAC HX. CPR ONGOING UPON ARRIVAL TO ER

## 2014-08-27 NOTE — Code Documentation (Signed)
Patient time of death occurred at 20. PRONOUNCED BY DR. Darl Householder

## 2014-08-27 NOTE — Code Documentation (Signed)
Organ procurement team notified. Referral number [id number]

## 2014-08-27 NOTE — Code Documentation (Signed)
Family at bedside with chaplain  

## 2014-08-27 DEATH — deceased

## 2014-12-11 ENCOUNTER — Other Ambulatory Visit: Payer: 59

## 2014-12-11 ENCOUNTER — Ambulatory Visit: Payer: 59 | Admitting: Oncology
